# Patient Record
Sex: Female | Born: 1990
Health system: Southern US, Community
[De-identification: ages and names within clinical notes are randomized; demographics above are authoritative.]

## PROBLEM LIST (undated history)

## (undated) ENCOUNTER — Emergency Department (HOSPITAL_COMMUNITY): Discharge status: Absent without leave | Payer: No Typology Code available for payment source

## (undated) DIAGNOSIS — A599 Trichomoniasis, unspecified: Secondary | ICD-10-CM

## (undated) HISTORY — PX: HAND SURGERY: SHX662

## (undated) HISTORY — DX: Trichomoniasis, unspecified: A59.9

---
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
## 2014-02-07 ENCOUNTER — Encounter (HOSPITAL_COMMUNITY): Payer: Self-pay | Admitting: Emergency Medicine

## 2014-02-07 ENCOUNTER — Emergency Department (HOSPITAL_COMMUNITY)
Admission: EM | Admit: 2014-02-07 | Discharge: 2014-02-07 | Disposition: A | Discharge status: Home / Self Care | Payer: Medicaid Other | Attending: Emergency Medicine | Admitting: Emergency Medicine

## 2014-02-07 ENCOUNTER — Emergency Department (HOSPITAL_COMMUNITY): Payer: Medicaid Other

## 2014-02-07 DIAGNOSIS — Y92410 Unspecified street and highway as the place of occurrence of the external cause: Secondary | ICD-10-CM | POA: Diagnosis not present

## 2014-02-07 DIAGNOSIS — S8990XA Unspecified injury of unspecified lower leg, initial encounter: Secondary | ICD-10-CM | POA: Diagnosis present

## 2014-02-07 DIAGNOSIS — S99929A Unspecified injury of unspecified foot, initial encounter: Secondary | ICD-10-CM | POA: Diagnosis present

## 2014-02-07 DIAGNOSIS — S8991XA Unspecified injury of right lower leg, initial encounter: Secondary | ICD-10-CM

## 2014-02-07 DIAGNOSIS — S0990XA Unspecified injury of head, initial encounter: Secondary | ICD-10-CM | POA: Diagnosis not present

## 2014-02-07 DIAGNOSIS — Y9389 Activity, other specified: Secondary | ICD-10-CM | POA: Diagnosis not present

## 2014-02-07 DIAGNOSIS — F17200 Nicotine dependence, unspecified, uncomplicated: Secondary | ICD-10-CM | POA: Insufficient documentation

## 2014-02-07 DIAGNOSIS — S99919A Unspecified injury of unspecified ankle, initial encounter: Principal | ICD-10-CM

## 2014-02-07 MED ORDER — METHOCARBAMOL 500 MG PO TABS: 500.0000 mg | ORAL_TABLET | Freq: Two times a day (BID) | ORAL | Status: DC

## 2014-02-07 MED ORDER — IBUPROFEN 800 MG PO TABS
800.0000 mg | ORAL_TABLET | Freq: Once | ORAL | Status: AC
  Administered 2014-02-07: 800 mg via ORAL
  Filled 2014-02-07: qty 1

## 2014-02-07 MED ORDER — IBUPROFEN 800 MG PO TABS: 800.0000 mg | ORAL_TABLET | Freq: Three times a day (TID) | ORAL | Status: DC
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
## 2014-02-07 NOTE — ED Provider Notes (Signed)
Medical screening examination/treatment/procedure(s) were performed by non-physician practitioner and as supervising physician I was immediately available for consultation/collaboration.   EKG Interpretation None        Audree CamelScott T Janelys Glassner, MD 02/07/14 2221

## 2014-02-07 NOTE — Discharge Instructions (Signed)

## 2014-02-07 NOTE — Progress Notes (Signed)
Orthopedic Tech Progress Note Patient Details:  Susie CassetteMariah J Mccuistion 06/03/1991 [id number] Applied knee sleeve to RLE.  Pulses, sensation, motion intact before and after application.  Capillary refill less than 2 seconds before and after application. Ortho Devices Type of Ortho Device: Knee Sleeve Ortho Device/Splint Location: RLE Ortho Device/Splint Interventions: Application   Lesle ChrisGilliland, Ronniesha Seibold L 02/07/2014, 5:49 PM
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
## 2015-01-20 ENCOUNTER — Encounter (HOSPITAL_COMMUNITY): Payer: Self-pay | Admitting: *Deleted

## 2015-01-20 ENCOUNTER — Emergency Department (HOSPITAL_COMMUNITY): Payer: Medicaid Other

## 2015-01-20 ENCOUNTER — Emergency Department (HOSPITAL_COMMUNITY)
Admission: EM | Admit: 2015-01-20 | Discharge: 2015-01-20 | Disposition: A | Discharge status: Home / Self Care | Payer: Medicaid Other | Attending: Emergency Medicine | Admitting: Emergency Medicine

## 2015-01-20 DIAGNOSIS — Z720 Tobacco use: Secondary | ICD-10-CM | POA: Insufficient documentation

## 2015-01-20 DIAGNOSIS — Y998 Other external cause status: Secondary | ICD-10-CM | POA: Diagnosis not present

## 2015-01-20 DIAGNOSIS — S299XXA Unspecified injury of thorax, initial encounter: Secondary | ICD-10-CM | POA: Insufficient documentation

## 2015-01-20 DIAGNOSIS — Z79899 Other long term (current) drug therapy: Secondary | ICD-10-CM | POA: Diagnosis not present

## 2015-01-20 DIAGNOSIS — S0083XA Contusion of other part of head, initial encounter: Secondary | ICD-10-CM

## 2015-01-20 DIAGNOSIS — Z3202 Encounter for pregnancy test, result negative: Secondary | ICD-10-CM | POA: Diagnosis not present

## 2015-01-20 DIAGNOSIS — S0592XA Unspecified injury of left eye and orbit, initial encounter: Secondary | ICD-10-CM | POA: Diagnosis present

## 2015-01-20 DIAGNOSIS — Z791 Long term (current) use of non-steroidal anti-inflammatories (NSAID): Secondary | ICD-10-CM | POA: Diagnosis not present

## 2015-01-20 DIAGNOSIS — T1490XA Injury, unspecified, initial encounter: Secondary | ICD-10-CM

## 2015-01-20 DIAGNOSIS — Y9389 Activity, other specified: Secondary | ICD-10-CM | POA: Diagnosis not present

## 2015-01-20 DIAGNOSIS — S022XXA Fracture of nasal bones, initial encounter for closed fracture: Secondary | ICD-10-CM | POA: Insufficient documentation

## 2015-01-20 DIAGNOSIS — Y92009 Unspecified place in unspecified non-institutional (private) residence as the place of occurrence of the external cause: Secondary | ICD-10-CM | POA: Diagnosis not present

## 2015-01-20 LAB — POC URINE PREG, ED: Preg Test, Ur: NEGATIVE

## 2015-01-20 MED ORDER — OXYCODONE-ACETAMINOPHEN 5-325 MG PO TABS: 1.0000 | ORAL_TABLET | Freq: Once | ORAL | Status: DC

## 2015-01-20 MED ORDER — OXYCODONE-ACETAMINOPHEN 5-325 MG PO TABS
1.0000 | ORAL_TABLET | Freq: Once | ORAL | Status: AC
  Administered 2015-01-20: 1 via ORAL
  Filled 2015-01-20: qty 1

## 2015-01-20 NOTE — Discharge Instructions (Signed)
Contusion A contusion is a deep bruise. Contusions are the result of an injury that caused bleeding under the skin. The contusion may turn blue, purple, or yellow. Minor injuries will give you a painless contusion, but more severe contusions may stay painful and swollen for a few weeks.  CAUSES  A contusion is usually caused by a blow, trauma, or direct force to an area of the body. SYMPTOMS   Swelling and redness of the injured area.  Bruising of the injured area.  Tenderness and soreness of the injured area.  Pain. DIAGNOSIS  The diagnosis can be made by taking a history and physical exam. An X-ray, CT scan, or MRI may be needed to determine if there were any associated injuries, such as fractures. TREATMENT  Specific treatment will depend on what area of the body was injured. In general, the best treatment for a contusion is resting, icing, elevating, and applying cold compresses to the injured area. Over-the-counter medicines may also be recommended for pain control. Ask your caregiver what the best treatment is for your contusion. HOME CARE INSTRUCTIONS   Put ice on the injured area.  Put ice in a plastic bag.  Place a towel between your skin and the bag.  Leave the ice on for 15-20 minutes, 3-4 times a day, or as directed by your health care provider.  Only take over-the-counter or prescription medicines for pain, discomfort, or fever as directed by your caregiver. Your caregiver may recommend avoiding anti-inflammatory medicines (aspirin, ibuprofen, and naproxen) for 48 hours because these medicines may increase bruising.  Rest the injured area.  If possible, elevate the injured area to reduce swelling. SEEK IMMEDIATE MEDICAL CARE IF:   You have increased bruising or swelling.  You have pain that is getting worse.  Your swelling or pain is not relieved with medicines. MAKE SURE YOU:   Understand these instructions.  Will watch your condition.  Will get help right  away if you are not doing well or get worse. Document Released: 04/01/2005 Document Revised: 06/27/2013 Document Reviewed: 04/27/2011 [facility] Patient Information 2015 [place], [place]. This information is not intended to replace advice given to you by your health care provider. Make sure you discuss any questions you have with your health care provider. Nasal Fracture A nasal fracture is a break or crack in the bones of the nose. A minor break usually heals in a month. You often will receive black eyes from a nasal fracture. This is not a cause for concern. The black eyes will go away over 1 to 2 weeks.  DIAGNOSIS  Your caregiver may want to examine you if you are concerned about a fracture of the nose. X-rays of the nose may not show a nasal fracture even when one is present. Sometimes your caregiver must wait 1 to 5 days after the injury to re-check the nose for alignment and to take additional X-rays. Sometimes the caregiver must wait until the swelling has gone down. TREATMENT Minor fractures that have caused no deformity often do not require treatment. More serious fractures where bones are displaced may require surgery. This will take place after the swelling is gone. Surgery will stabilize and align the fracture. HOME CARE INSTRUCTIONS   Put ice on the injured area.  Put ice in a plastic bag.  Place a towel between your skin and the bag.  Leave the ice on for 15-20 minutes, 03-04 times a day.  Take medications as directed by your caregiver.  Only take over-the-counter  or prescription medicines for pain, discomfort, or fever as directed by your caregiver.  If your nose starts bleeding, squeeze the soft parts of the nose against the center wall while you are sitting in an upright position for 10 minutes.  Contact sports should be avoided for at least 3 to 4 weeks or as directed by your caregiver. SEEK MEDICAL CARE IF:  Your pain increases or becomes severe.  You continue to have  nosebleeds.  The shape of your nose does not return to normal within 5 days.  You have pus draining from the nose. SEEK IMMEDIATE MEDICAL CARE IF:   You have bleeding from your nose that does not stop after 20 minutes of pinching the nostrils closed and keeping ice on the nose.  You have clear fluid draining from your nose.  You notice a grape-like swelling on the dividing wall between the nostrils (septum). This is a collection of blood (hematoma) that must be drained to help prevent infection.  You have difficulty moving your eyes.  You have recurrent vomiting. Document Released: 06/19/2000 Document Revised: 09/14/2011 Document Reviewed: 10/06/2010 [facility] Patient Information 2015 [place]. This information is not intended to replace advice given to you by your health care provider. Make sure you discuss any questions you have with your health care provider.
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
## 2015-01-20 NOTE — ED Notes (Signed)
Pt d/c, verbalized understanding d/c instructions.

## 2015-01-20 NOTE — ED Notes (Signed)
Pt given po fluids, aware that a urine sample is needed.
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
## 2015-01-20 NOTE — ED Notes (Signed)
NT Gerilyn PilgrimJacob asked for urine sample and is unable to do give at the moment. Juice has been given.

## 2015-08-07 ENCOUNTER — Emergency Department (HOSPITAL_COMMUNITY): Payer: Medicaid Other

## 2015-08-07 ENCOUNTER — Encounter (HOSPITAL_COMMUNITY): Payer: Self-pay | Admitting: Emergency Medicine

## 2015-08-07 ENCOUNTER — Emergency Department (HOSPITAL_COMMUNITY)
Admission: EM | Admit: 2015-08-07 | Discharge: 2015-08-07 | Disposition: A | Discharge status: Home / Self Care | Payer: Medicaid Other | Attending: Emergency Medicine | Admitting: Emergency Medicine

## 2015-08-07 DIAGNOSIS — R05 Cough: Secondary | ICD-10-CM | POA: Diagnosis present

## 2015-08-07 DIAGNOSIS — R51 Headache: Secondary | ICD-10-CM | POA: Diagnosis not present

## 2015-08-07 DIAGNOSIS — R197 Diarrhea, unspecified: Secondary | ICD-10-CM | POA: Diagnosis not present

## 2015-08-07 DIAGNOSIS — R1084 Generalized abdominal pain: Secondary | ICD-10-CM | POA: Diagnosis not present

## 2015-08-07 DIAGNOSIS — R1915 Other abnormal bowel sounds: Secondary | ICD-10-CM | POA: Diagnosis not present

## 2015-08-07 DIAGNOSIS — R110 Nausea: Secondary | ICD-10-CM | POA: Insufficient documentation

## 2015-08-07 DIAGNOSIS — F17200 Nicotine dependence, unspecified, uncomplicated: Secondary | ICD-10-CM | POA: Insufficient documentation

## 2015-08-07 DIAGNOSIS — R5383 Other fatigue: Secondary | ICD-10-CM | POA: Diagnosis not present

## 2015-08-07 DIAGNOSIS — Z3202 Encounter for pregnancy test, result negative: Secondary | ICD-10-CM | POA: Diagnosis not present

## 2015-08-07 DIAGNOSIS — M791 Myalgia: Secondary | ICD-10-CM | POA: Insufficient documentation

## 2015-08-07 DIAGNOSIS — R0981 Nasal congestion: Secondary | ICD-10-CM | POA: Diagnosis not present

## 2015-08-07 DIAGNOSIS — R6889 Other general symptoms and signs: Secondary | ICD-10-CM

## 2015-08-07 LAB — CBC
HEMATOCRIT: 39.5 % (ref 36.0–46.0)
Hemoglobin: 13.6 g/dL (ref 12.0–15.0)
MCH: 32.9 pg (ref 26.0–34.0)
MCHC: 34.4 g/dL (ref 30.0–36.0)
MCV: 95.6 fL (ref 78.0–100.0)
Platelets: 223 10*3/uL (ref 150–400)
RBC: 4.13 MIL/uL (ref 3.87–5.11)
RDW: 12.7 % (ref 11.5–15.5)
WBC: 6.6 10*3/uL (ref 4.0–10.5)

## 2015-08-07 LAB — URINE MICROSCOPIC-ADD ON

## 2015-08-07 LAB — COMPREHENSIVE METABOLIC PANEL
ALBUMIN: 4 g/dL (ref 3.5–5.0)
ALT: 14 U/L (ref 14–54)
AST: 18 U/L (ref 15–41)
Alkaline Phosphatase: 47 U/L (ref 38–126)
Anion gap: 10 (ref 5–15)
CHLORIDE: 103 mmol/L (ref 101–111)
CO2: 25 mmol/L (ref 22–32)
Calcium: 9 mg/dL (ref 8.9–10.3)
Creatinine, Ser: 0.78 mg/dL (ref 0.44–1.00)
GFR calc Af Amer: >60 mL/min (ref >60)
Glucose, Bld: 88 mg/dL (ref 65–99)
POTASSIUM: 3.6 mmol/L (ref 3.5–5.1)
SODIUM: 138 mmol/L (ref 135–145)
Total Bilirubin: 1.8 mg/dL — ABNORMAL HIGH (ref 0.3–1.2)
Total Protein: 7 g/dL (ref 6.5–8.1)

## 2015-08-07 LAB — POC URINE PREG, ED: Preg Test, Ur: NEGATIVE

## 2015-08-07 LAB — URINALYSIS, ROUTINE W REFLEX MICROSCOPIC
Bilirubin Urine: NEGATIVE
GLUCOSE, UA: NEGATIVE mg/dL
KETONES UR: 15 mg/dL — AB
LEUKOCYTES UA: NEGATIVE
Nitrite: NEGATIVE
PH: 7.5 (ref 5.0–8.0)
Protein, ur: NEGATIVE mg/dL
Specific Gravity, Urine: 1.014 (ref 1.005–1.030)

## 2015-08-07 LAB — LIPASE, BLOOD: LIPASE: 24 U/L (ref 11–51)

## 2015-08-07 MED ORDER — SODIUM CHLORIDE 0.9 % IV BOLUS (SEPSIS)
1000.0000 mL | Freq: Once | INTRAVENOUS | Status: AC
  Administered 2015-08-07: 1000 mL via INTRAVENOUS

## 2015-08-07 MED ORDER — KETOROLAC TROMETHAMINE 30 MG/ML IJ SOLN
30.0000 mg | Freq: Once | INTRAMUSCULAR | Status: AC
  Administered 2015-08-07: 30 mg via INTRAVENOUS
  Filled 2015-08-07: qty 1

## 2015-08-07 MED ORDER — ONDANSETRON HCL 4 MG PO TABS: 4.0000 mg | ORAL_TABLET | Freq: Four times a day (QID) | ORAL | Status: DC

## 2015-08-07 NOTE — ED Provider Notes (Signed)
CSN: [id number]     Arrival date & time 08/07/15  1350 History   First MD Initiated Contact with Patient 08/07/15 1623     Chief Complaint  Patient presents with  . URI  . Diarrhea   HPI  Brooke Mejia is a 25 year old female presenting with multiple complaints. She reports onset of URI symptoms and diarrhea 3 days ago. She endorses nasal congestion, throbbing headache and productive cough. She states she is coughing up green-yellow sputum. She denies purulent nasal discharge. Her headache is generalized and mild. She endorses generalized myalgias. She states that her whole body feels like it is throbbing with her head. She endorses daily episodes of diarrhea. She states that she has approximately 4 episodes of diarrhea per day. She states that it is watery stool without blood. She reports generalized cramping abdominal pain precedes the diarrhea. After having a bowel movement, the abdominal pain resolves until her next bowel movement. She is also complaining of intermittent nausea without vomiting. Her son was evaluated in the emergency department earlier today for similar symptoms. He is diagnosed with a viral illness and discharged. He did not get her flu shot this year. She is a Archivist. Denies fevers, chills, dizziness, syncope, vision changes, neck pain, neck stiffness, ear pain, eye discharge, sore throat, chest pain, SOB, dysuria, or rashes. She has taken dayquil, nyquil and tamiflu without relief.   History reviewed. No pertinent past medical history. Past Surgical History  Procedure Laterality Date  . Hand surgery     No family history on file. Social History  Substance Use Topics  . Smoking status: Current Every Day Smoker  . Smokeless tobacco: None  . Alcohol Use: No   OB History    No data available     Review of Systems  Constitutional: Positive for fatigue. Negative for fever and chills.  HENT: Positive for congestion. Negative for ear pain, rhinorrhea and sore throat.    Eyes: Negative for discharge, redness and visual disturbance.  Respiratory: Positive for cough. Negative for shortness of breath and wheezing.   Cardiovascular: Negative for chest pain.  Gastrointestinal: Positive for nausea, abdominal pain and diarrhea. Negative for vomiting.  Genitourinary: Negative for dysuria and flank pain.  Musculoskeletal: Positive for myalgias. Negative for neck pain and neck stiffness.  Skin: Negative for rash.  Neurological: Positive for headaches. Negative for dizziness, syncope and weakness.  All other systems reviewed and are negative.     Allergies  Review of patient's allergies indicates no known allergies.  Home Medications   Prior to Admission medications   Medication Sig Start Date End Date Taking? Authorizing Provider  ibuprofen (ADVIL,MOTRIN) 200 MG tablet Take 400 mg by mouth every 6 (six) hours as needed for headache.    Yes Historical Provider, MD  levonorgestrel (MIRENA) 20 MCG/24HR IUD 1 each by Intrauterine route once. 08/2010   Yes Historical Provider, MD  Pseudoeph-Doxylamine-DM-APAP (NYQUIL PO) Take 15 mLs by mouth at bedtime as needed. For cold and flu symptoms   Yes Historical Provider, MD  Pseudoephedrine-APAP-DM (DAYQUIL PO) Take 15 mLs by mouth daily as needed. For cold and flu symptoms   Yes Historical Provider, MD  ondansetron (ZOFRAN) 4 MG tablet Take 1 tablet (4 mg total) by mouth every 6 (six) hours. 08/07/15   Juancarlos Crescenzo, PA-C  oxyCODONE-acetaminophen (PERCOCET/ROXICET) 5-325 MG per tablet Take 1-2 tablets by mouth once. Patient not taking: Reported on 08/07/2015 01/20/15   Lorre Nick, MD   BP 103/65 mmHg  Pulse 61  Temp(Src) 98.8 F (37.1 C) (Oral)  Resp 16  Ht  (1.702 m)  Wt 56.7 kg  BMI 19.57 kg/m2  SpO2 100%  LMP 07/21/2015 Physical Exam  Constitutional: She appears well-developed and well-nourished. No distress.  HENT:  Head: Normocephalic and atraumatic.  Mouth/Throat: Oropharynx is clear and moist. No  oropharyngeal exudate.  Eyes: Conjunctivae are normal. Right eye exhibits no discharge. Left eye exhibits no discharge. No scleral icterus.  Neck: Normal range of motion. Neck supple.  Cardiovascular: Normal rate, regular rhythm and normal heart sounds.   Pulmonary/Chest: Effort normal and breath sounds normal. No respiratory distress. She has no wheezes. She has no rales.  Abdominal: Soft. She exhibits no distension. Bowel sounds are increased. There is no tenderness. There is no rebound and no guarding.  Musculoskeletal: Normal range of motion.  Lymphadenopathy:    She has no cervical adenopathy.  Neurological: She is alert. Coordination normal.  Skin: Skin is warm and dry.  Psychiatric: She has a normal mood and affect. Her behavior is normal.  Nursing note and vitals reviewed.   ED Course  Procedures (including critical care time) Labs Review Labs Reviewed  COMPREHENSIVE METABOLIC PANEL - Abnormal; Notable for the following:    BUN <5 (*)    Total Bilirubin 1.8 (*)    All other components within normal limits  URINALYSIS, ROUTINE W REFLEX MICROSCOPIC (NOT AT [facility]) - Abnormal; Notable for the following:    Hgb urine dipstick TRACE (*)    Ketones, ur 15 (*)    All other components within normal limits  URINE MICROSCOPIC-ADD ON - Abnormal; Notable for the following:    Squamous Epithelial / LPF 6-30 (*)    Bacteria, UA FEW (*)    All other components within normal limits  LIPASE, BLOOD  CBC  POC URINE PREG, ED    Imaging Review Dg Chest 2 View  08/07/2015  CLINICAL DATA:  25 year old female with lightheadedness for the past 2 days and productive cough (yellowish green phlegm) for the past 3 days. EXAM: CHEST  2 VIEW COMPARISON:  Chest x-ray a 01/20/2015. FINDINGS: Lung volumes are normal. No consolidative airspace disease. No pleural effusions. No pneumothorax. No pulmonary nodule or mass noted. Pulmonary vasculature and the cardiomediastinal silhouette are within normal limits.  IMPRESSION: No radiographic evidence of acute cardiopulmonary disease. Electronically Signed   By: Trudie Reed M.D.   On: 08/07/2015 15:58   I have personally reviewed and evaluated these images and lab results as part of my medical decision-making.   EKG Interpretation None      MDM   Final diagnoses:  Flu-like symptoms   25 year old female presenting with flulike illness with symptoms including generalized myalgias, fatigue, productive cough, congestion, abdominal cramping and diarrhea x 3 days. Vital signs stable. Patient is afebrile and nontoxic appearing. No nasal mucosa edema noted. TMs gray with good visualization anatomy bilaterally. No oropharyngeal erythema or exudate. No cervical adenopathy. Lungs are clear to auscultation bilaterally. Heart regular rate and rhythm. Abdomen is soft without tenderness or peritoneal signs. Patient has not had an episode of diarrhea while in the emergency department. Lab work is unremarkable. Chest x-ray negative for acute findings. She did with Toradol and 1 L IV fluid bolus. Patient reports improvement in her symptoms and is requesting to go home. Patient's presentation consistent with a viral illness. We'll discharge with Zofran. Return precautions given in discharge paperwork and discussed with pt at bedside. Pt stable for discharge  Alveta Heimlich, PA-C 08/07/15 1936  Nelva Nay, MD 08/11/15 [phone]

## 2015-08-07 NOTE — ED Notes (Signed)
Onset 3 days ago developed productive cough yellow sputum and diarrhea 2 days ago watery stool. General abdominal pain achy cramping 6/10.

## 2015-08-07 NOTE — Discharge Instructions (Signed)
Upper Respiratory Infection, Adult °Most upper respiratory infections (URIs) are a viral infection of the air passages leading to the lungs. A URI affects the nose, throat, and upper air passages. The most common type of URI is nasopharyngitis and is typically referred to as "the common cold." °URIs run their course and usually go away on their own. Most of the time, a URI does not require medical attention, but sometimes a bacterial infection in the upper airways can follow a viral infection. This is called a secondary infection. Sinus and middle ear infections are common types of secondary upper respiratory infections. °Bacterial pneumonia can also complicate a URI. A URI can worsen asthma and chronic obstructive pulmonary disease (COPD). Sometimes, these complications can require emergency medical care and may be life threatening.  °CAUSES °Almost all URIs are caused by viruses. A virus is a type of germ and can spread from one person to another.  °RISKS FACTORS °You may be at risk for a URI if:  °· You smoke.   °· You have chronic heart or lung disease. °· You have a weakened defense (immune) system.   °· You are very young or very old.   °· You have nasal allergies or asthma. °· You work in crowded or poorly ventilated areas. °· You work in health care facilities or schools. °SIGNS AND SYMPTOMS  °Symptoms typically develop 2-3 days after you come in contact with a cold virus. Most viral URIs last 7-10 days. However, viral URIs from the influenza virus (flu virus) can last 14-18 days and are typically more severe. Symptoms may include:  °· Runny or stuffy (congested) nose.   °· Sneezing.   °· Cough.   °· Sore throat.   °· Headache.   °· Fatigue.   °· Fever.   °· Loss of appetite.   °· Pain in your forehead, behind your eyes, and over your cheekbones (sinus pain). °· Muscle aches.   °DIAGNOSIS  °Your health care provider may diagnose a URI by: °· Physical exam. °· Tests to check that your symptoms are not due to  another condition such as: °· Strep throat. °· Sinusitis. °· Pneumonia. °· Asthma. °TREATMENT  °A URI goes away on its own with time. It cannot be cured with medicines, but medicines may be prescribed or recommended to relieve symptoms. Medicines may help: °· Reduce your fever. °· Reduce your cough. °· Relieve nasal congestion. °HOME CARE INSTRUCTIONS  °· Take medicines only as directed by your health care provider.   °· Gargle warm saltwater or take cough drops to comfort your throat as directed by your health care provider. °· Use a warm mist humidifier or inhale steam from a shower to increase air moisture. This may make it easier to breathe. °· Drink enough fluid to keep your urine clear or pale yellow.   °· Eat soups and other clear broths and maintain good nutrition.   °· Rest as needed.   °· Return to work when your temperature has returned to normal or as your health care provider advises. You may need to stay home longer to avoid infecting others. You can also use a face mask and careful hand washing to prevent spread of the virus. °· Increase the usage of your inhaler if you have asthma.   °· Do not use any tobacco products, including cigarettes, chewing tobacco, or electronic cigarettes. If you need help quitting, ask your health care provider. °PREVENTION  °The best way to protect yourself from getting a cold is to practice good hygiene.  °· Avoid oral or hand contact with people with cold   symptoms.   °· Wash your hands often if contact occurs.   °There is no clear evidence that vitamin C, vitamin E, echinacea, or exercise reduces the chance of developing a cold. However, it is always recommended to get plenty of rest, exercise, and practice good nutrition.  °SEEK MEDICAL CARE IF:  °· You are getting worse rather than better.   °· Your symptoms are not controlled by medicine.   °· You have chills. °· You have worsening shortness of breath. °· You have brown or red mucus. °· You have yellow or brown nasal  discharge. °· You have pain in your face, especially when you bend forward. °· You have a fever. °· You have swollen neck glands. °· You have pain while swallowing. °· You have white areas in the back of your throat. °SEEK IMMEDIATE MEDICAL CARE IF:  °· You have severe or persistent: °¨ Headache. °¨ Ear pain. °¨ Sinus pain. °¨ Chest pain. °· You have chronic lung disease and any of the following: °¨ Wheezing. °¨ Prolonged cough. °¨ Coughing up blood. °¨ A change in your usual mucus. °· You have a stiff neck. °· You have changes in your: °¨ Vision. °¨ Hearing. °¨ Thinking. °¨ Mood. °MAKE SURE YOU:  °· Understand these instructions. °· Will watch your condition. °· Will get help right away if you are not doing well or get worse. °  °This information is not intended to replace advice given to you by your health care provider. Make sure you discuss any questions you have with your health care provider. °  °Document Released: 12/16/2000 Document Revised: 11/06/2014 Document Reviewed: 09/27/2013 °Elsevier Interactive Patient Education ©2016 [employer] °Diarrhea °Diarrhea is frequent loose and watery bowel movements. It can cause you to feel weak and dehydrated. Dehydration can cause you to become tired and thirsty, have a dry mouth, and have decreased urination that often is dark yellow. Diarrhea is a sign of another problem, most often an infection that will not last long. In most cases, diarrhea typically lasts 2-3 days. However, it can last longer if it is a sign of something more serious. It is important to treat your diarrhea as directed by your caregiver to lessen or prevent future episodes of diarrhea. °CAUSES  °Some common causes include: °· Gastrointestinal infections caused by viruses, bacteria, or parasites. °· Food poisoning or food allergies. °· Certain medicines, such as antibiotics, chemotherapy, and laxatives. °· Artificial sweeteners and fructose. °· Digestive disorders. °HOME CARE  INSTRUCTIONS °· Ensure adequate fluid intake (hydration): Have 1 cup (8 oz) of fluid for each diarrhea episode. Avoid fluids that contain simple sugars or sports drinks, fruit juices, whole milk products, and sodas. Your urine should be clear or pale yellow if you are drinking enough fluids. Hydrate with an oral rehydration solution that you can purchase at pharmacies, retail stores, and online. You can prepare an oral rehydration solution at home by mixing the following ingredients together: °¨  - tsp table salt. °¨ ¾ tsp baking soda. °¨  tsp salt substitute containing potassium chloride. °¨ 1  tablespoons sugar. °¨ 1 L (34 oz) of water. °· Certain foods and beverages may increase the speed at which food moves through the gastrointestinal (GI) tract. These foods and beverages should be avoided and include: °¨ Caffeinated and alcoholic beverages. °¨ High-fiber foods, such as raw fruits and vegetables, nuts, seeds, and whole grain breads and cereals. °¨ Foods and beverages sweetened with sugar alcohols, such as xylitol, sorbitol, and mannitol. °· Some foods may   be well tolerated and may help thicken stool including: °¨ Starchy foods, such as rice, toast, pasta, low-sugar cereal, oatmeal, grits, baked potatoes, crackers, and bagels. °¨ Bananas. °¨ Applesauce. °· Add probiotic-rich foods to help increase healthy bacteria in the GI tract, such as yogurt and fermented milk products. °· Wash your hands well after each diarrhea episode. °· Only take over-the-counter or prescription medicines as directed by your caregiver. °· Take a warm bath to relieve any burning or pain from frequent diarrhea episodes. °SEEK IMMEDIATE MEDICAL CARE IF:  °· You are unable to keep fluids down. °· You have persistent vomiting. °· You have blood in your stool, or your stools are black and tarry. °· You do not urinate in 6-8 hours, or there is only a small amount of very dark urine. °· You have abdominal pain that increases or  localizes. °· You have weakness, dizziness, confusion, or light-headedness. °· You have a severe headache. °· Your diarrhea gets worse or does not get better. °· You have a fever or persistent symptoms for more than 2-3 days. °· You have a fever and your symptoms suddenly get worse. °MAKE SURE YOU:  °· Understand these instructions. °· Will watch your condition. °· Will get help right away if you are not doing well or get worse. °  °This information is not intended to replace advice given to you by your health care provider. Make sure you discuss any questions you have with your health care provider. °  °Document Released: 06/12/2002 Document Revised: 07/13/2014 Document Reviewed: 02/28/2012 °Elsevier Interactive Patient Education ©2016 [employer] ° °

## 2015-08-08 ENCOUNTER — Encounter (HOSPITAL_COMMUNITY): Payer: Self-pay | Admitting: *Deleted

## 2015-08-08 ENCOUNTER — Emergency Department (HOSPITAL_COMMUNITY)
Admission: EM | Admit: 2015-08-08 | Discharge: 2015-08-08 | Disposition: A | Discharge status: Home / Self Care | Payer: Medicaid Other | Attending: Emergency Medicine | Admitting: Emergency Medicine

## 2015-08-08 DIAGNOSIS — Z793 Long term (current) use of hormonal contraceptives: Secondary | ICD-10-CM | POA: Insufficient documentation

## 2015-08-08 DIAGNOSIS — Z79899 Other long term (current) drug therapy: Secondary | ICD-10-CM | POA: Insufficient documentation

## 2015-08-08 DIAGNOSIS — J111 Influenza due to unidentified influenza virus with other respiratory manifestations: Secondary | ICD-10-CM

## 2015-08-08 DIAGNOSIS — R05 Cough: Secondary | ICD-10-CM | POA: Diagnosis present

## 2015-08-08 DIAGNOSIS — R197 Diarrhea, unspecified: Secondary | ICD-10-CM | POA: Diagnosis not present

## 2015-08-08 MED ORDER — NAPROXEN 250 MG PO TABS: 250.0000 mg | ORAL_TABLET | Freq: Two times a day (BID) | ORAL | Status: DC

## 2015-08-08 MED ORDER — CETIRIZINE HCL 10 MG PO TABS: 10.0000 mg | ORAL_TABLET | Freq: Every day | ORAL | Status: DC

## 2015-08-08 MED ORDER — IBUPROFEN 800 MG PO TABS
800.0000 mg | ORAL_TABLET | Freq: Once | ORAL | Status: AC
  Administered 2015-08-08: 800 mg via ORAL
  Filled 2015-08-08: qty 1

## 2015-08-08 MED ORDER — IBUPROFEN 400 MG PO TABS
400.0000 mg | ORAL_TABLET | Freq: Once | ORAL | Status: DC
  Filled 2015-08-08: qty 1

## 2015-08-08 MED ORDER — ONDANSETRON 4 MG PO TBDP: 4.0000 mg | ORAL_TABLET | Freq: Three times a day (TID) | ORAL | Status: DC | PRN

## 2015-08-08 MED ORDER — ONDANSETRON 4 MG PO TBDP
4.0000 mg | ORAL_TABLET | Freq: Once | ORAL | Status: AC
  Administered 2015-08-08: 4 mg via ORAL
  Filled 2015-08-08: qty 1

## 2015-08-08 MED ORDER — ACIDOPHILUS PROBIOTIC 10 MG PO TABS: 10.0000 mg | ORAL_TABLET | Freq: Three times a day (TID) | ORAL | Status: DC

## 2015-08-08 NOTE — ED Notes (Signed)
Patient with body aches and diarrhea.  She was seen here for same on yesterday.  She has not taken anything for pain today.   She is alert but tearful due to discomfort

## 2015-08-08 NOTE — ED Notes (Signed)
Patient reports she has not felt well since Sunday.  She has body aches, headache, cough, diarrhea.   Generalized weakness.  She was seen here for same on yesterday.  She has not taken anything except nyquil last night

## 2015-08-08 NOTE — ED Provider Notes (Signed)
CSN: [id number]     Arrival date & time 08/08/15  1748 History   First MD Initiated Contact with Patient 08/08/15 1749     Chief Complaint  Patient presents with  . Generalized Body Aches  . Diarrhea  . Cough  . Nausea   Brooke Mejia is a 25 y.o. female who presents the emergency department complaining of flulike symptoms for the past 5 days. Patient was seen in the emergency department for the same yesterday. She reports she is unable to get her prescriptions filled. She complains of generalized body aches, nausea, coughing, sneezing, runny nose and post nasal drip. She reports taking NyQuil last night with some relief. She has had nothing for treatment today. She reports subjective fever at home. She did not get her flu shot this year. She was seen in the emergency department for the same symptoms yesterday and had an unremarkable chest x-ray. She denies rashes, lightheadedness, dizziness, trouble breathing, shortness of breath, wheezing, neck pain, neck stiffness, ear pain, sore throat, trouble swallowing, urinary symptoms or rashes.  (Consider location/radiation/quality/duration/timing/severity/associated sxs/prior Treatment) HPI  History reviewed. No pertinent past medical history. Past Surgical History  Procedure Laterality Date  . Hand surgery     No family history on file. Social History  Substance Use Topics  . Smoking status: Current Every Day Smoker  . Smokeless tobacco: None  . Alcohol Use: No   OB History    No data available     Review of Systems  Constitutional: Positive for fever.  HENT: Positive for rhinorrhea and sneezing. Negative for congestion, ear pain, sore throat and trouble swallowing.   Eyes: Negative for visual disturbance.  Respiratory: Negative for cough, shortness of breath and wheezing.   Cardiovascular: Negative for chest pain and palpitations.  Gastrointestinal: Positive for nausea and diarrhea. Negative for vomiting and abdominal pain.   Genitourinary: Negative for dysuria, urgency, frequency, hematuria and difficulty urinating.  Musculoskeletal: Positive for myalgias. Negative for back pain and neck pain.  Skin: Negative for rash.  Neurological: Negative for headaches.      Allergies  Review of patient's allergies indicates no known allergies.  Home Medications   Prior to Admission medications   Medication Sig Start Date End Date Taking? Authorizing Provider  cetirizine (ZYRTEC ALLERGY) 10 MG tablet Take 1 tablet (10 mg total) by mouth daily. 08/08/15   Everlene Farrier, PA-C  ibuprofen (ADVIL,MOTRIN) 200 MG tablet Take 400 mg by mouth every 6 (six) hours as needed for headache.     Historical Provider, MD  Lactobacillus (ACIDOPHILUS PROBIOTIC) 10 MG TABS Take 10 mg by mouth 3 (three) times daily. 08/08/15   Everlene Farrier, PA-C  levonorgestrel (MIRENA) 20 MCG/24HR IUD 1 each by Intrauterine route once. 08/2010    Historical Provider, MD  naproxen (NAPROSYN) 250 MG tablet Take 1 tablet (250 mg total) by mouth 2 (two) times daily with a meal. 08/08/15   Everlene Farrier, PA-C  ondansetron (ZOFRAN ODT) 4 MG disintegrating tablet Take 1 tablet (4 mg total) by mouth every 8 (eight) hours as needed for nausea or vomiting. 08/08/15   Everlene Farrier, PA-C  ondansetron (ZOFRAN) 4 MG tablet Take 1 tablet (4 mg total) by mouth every 6 (six) hours. 08/07/15   Stevi Barrett, PA-C  oxyCODONE-acetaminophen (PERCOCET/ROXICET) 5-325 MG per tablet Take 1-2 tablets by mouth once. Patient not taking: Reported on 08/07/2015 01/20/15   Lorre Nick, MD  Pseudoeph-Doxylamine-DM-APAP (NYQUIL PO) Take 15 mLs by mouth at bedtime as needed. For cold and  flu symptoms    Historical Provider, MD  Pseudoephedrine-APAP-DM (DAYQUIL PO) Take 15 mLs by mouth daily as needed. For cold and flu symptoms    Historical Provider, MD   BP 103/74 mmHg  Pulse 84  Temp(Src) 98.1 F (36.7 C) (Oral)  Resp 20  Wt 54.114 kg  SpO2 100%  LMP 07/21/2015 Physical Exam   Constitutional: She appears well-developed and well-nourished. No distress.  Nontoxic appearing.  HENT:  Head: Normocephalic and atraumatic.  Right Ear: External ear normal.  Left Ear: External ear normal.  Mouth/Throat: Oropharynx is clear and moist.  Bilateral tympanic membranes are pearly-gray without erythema or loss of landmarks.  Boggy nasal turbinates bilaterally.  Eyes: Conjunctivae are normal. Pupils are equal, round, and reactive to light. Right eye exhibits no discharge. Left eye exhibits no discharge.  Neck: Normal range of motion. Neck supple. No JVD present. No tracheal deviation present.  Cardiovascular: Normal rate, regular rhythm, normal heart sounds and intact distal pulses.  Exam reveals no gallop and no friction rub.   No murmur heard. Pulmonary/Chest: Effort normal and breath sounds normal. No respiratory distress. She has no wheezes. She has no rales.  Lungs clear to auscultation bilaterally.  Abdominal: Soft. Bowel sounds are normal. She exhibits no distension. There is no tenderness. There is no guarding.  Musculoskeletal: She exhibits no edema.  Lymphadenopathy:    She has no cervical adenopathy.  Neurological: She is alert. Coordination normal.  Skin: Skin is warm and dry. No rash noted. She is not diaphoretic. No erythema. No pallor.  Psychiatric: She has a normal mood and affect. Her behavior is normal.  Nursing note and vitals reviewed.   ED Course  Procedures (including critical care time) Labs Review Labs Reviewed - No data to display  Imaging Review Dg Chest 2 View  08/07/2015  CLINICAL DATA:  25 year old female with lightheadedness for the past 2 days and productive cough (yellowish green phlegm) for the past 3 days. EXAM: CHEST  2 VIEW COMPARISON:  Chest x-ray a 01/20/2015. FINDINGS: Lung volumes are normal. No consolidative airspace disease. No pleural effusions. No pneumothorax. No pulmonary nodule or mass noted. Pulmonary vasculature and the  cardiomediastinal silhouette are within normal limits. IMPRESSION: No radiographic evidence of acute cardiopulmonary disease. Electronically Signed   By: Trudie Reed M.D.   On: 08/07/2015 15:58   I have personally reviewed and evaluated these images as part of my medical decision-making.   EKG Interpretation None      Filed Vitals:   08/08/15 1755 08/08/15 1800  BP:  103/74  Pulse:  84  Temp:  98.1 F (36.7 C)  TempSrc:  Oral  Resp:  20  Weight: 54.114 kg   SpO2:  100%     MDM   Meds given in ED:  Medications  ibuprofen (ADVIL,MOTRIN) tablet 800 mg (800 mg Oral Given 08/08/15 1829)  ondansetron (ZOFRAN-ODT) disintegrating tablet 4 mg (4 mg Oral Given 08/08/15 1827)    New Prescriptions   CETIRIZINE (ZYRTEC ALLERGY) 10 MG TABLET    Take 1 tablet (10 mg total) by mouth daily.   LACTOBACILLUS (ACIDOPHILUS PROBIOTIC) 10 MG TABS    Take 10 mg by mouth 3 (three) times daily.   NAPROXEN (NAPROSYN) 250 MG TABLET    Take 1 tablet (250 mg total) by mouth 2 (two) times daily with a meal.   ONDANSETRON (ZOFRAN ODT) 4 MG DISINTEGRATING TABLET    Take 1 tablet (4 mg total) by mouth every 8 (eight) hours as  needed for nausea or vomiting.    Final diagnoses:  Influenza   This is a 25 y.o. female who presents the emergency department complaining of flulike symptoms for the past 5 days. Patient was seen in the emergency department for the same yesterday. She reports she is unable to get her prescriptions filled. She complains of generalized body aches, nausea, coughing, sneezing, runny nose and post nasal drip. She reports taking NyQuil last night with some relief. She has had nothing for treatment today.  On exam the patient is afebrile nontoxic appearing. Her throat is clear. Lungs are clear to auscultation bilaterally. No wheezes or rhonchi. TMs are normal bilaterally. Abdomen is soft and nontender. Patient had a chest x-ray yesterday which was negative for pneumonia. Patient with continued  influenza-like illness. Patient was provided with Zofran and ibuprofen. Had reevaluation patient reports feeling much better and she is tolerating oral liquids well. I discussed the expected course and treatment of influenza. She is out of the range for Tamiflu. We'll discharge with prescriptions for Zyrtec, probiotic, Naprosyn and Zofran. I discussed strict and specific return precautions. I advised the patient to follow-up with their primary care provider this week. I advised the patient to return to the emergency department with new or worsening symptoms or new concerns. The patient verbalized understanding and agreement with plan.      Everlene Farrier, PA-C 08/08/15 1937  Margarita Grizzle, MD 08/09/15 [phone]

## 2015-08-08 NOTE — Discharge Instructions (Signed)

## 2015-08-09 MED FILL — ONDANSETRON ODT 4 MG TABLET: 4 | 3 days supply | Qty: 10 | Fill #0

## 2017-06-06 ENCOUNTER — Emergency Department (HOSPITAL_COMMUNITY): Payer: Self-pay

## 2017-06-06 ENCOUNTER — Other Ambulatory Visit: Payer: Self-pay

## 2017-06-06 ENCOUNTER — Emergency Department (HOSPITAL_COMMUNITY)
Admission: EM | Admit: 2017-06-06 | Discharge: 2017-06-06 | Disposition: A | Discharge status: Home / Self Care | Payer: Self-pay | Attending: Emergency Medicine | Admitting: Emergency Medicine

## 2017-06-06 ENCOUNTER — Encounter (HOSPITAL_COMMUNITY): Payer: Self-pay | Admitting: Emergency Medicine

## 2017-06-06 DIAGNOSIS — N83209 Unspecified ovarian cyst, unspecified side: Secondary | ICD-10-CM

## 2017-06-06 DIAGNOSIS — N83202 Unspecified ovarian cyst, left side: Secondary | ICD-10-CM | POA: Insufficient documentation

## 2017-06-06 DIAGNOSIS — Z79899 Other long term (current) drug therapy: Secondary | ICD-10-CM | POA: Insufficient documentation

## 2017-06-06 LAB — URINALYSIS, ROUTINE W REFLEX MICROSCOPIC
BACTERIA UA: NONE SEEN
Bilirubin Urine: NEGATIVE
Glucose, UA: NEGATIVE mg/dL
KETONES UR: NEGATIVE mg/dL
NITRITE: NEGATIVE
PROTEIN: NEGATIVE mg/dL
Specific Gravity, Urine: 1.019 (ref 1.005–1.030)
WBC UA: NONE SEEN WBC/hpf (ref 0–5)
pH: 7 (ref 5.0–8.0)

## 2017-06-06 LAB — POC URINE PREG, ED: Preg Test, Ur: NEGATIVE

## 2017-06-06 MED ORDER — FENTANYL CITRATE (PF) 100 MCG/2ML IJ SOLN
100.0000 ug | Freq: Once | INTRAMUSCULAR | Status: AC
  Administered 2017-06-06: 100 ug via INTRAVENOUS
  Filled 2017-06-06: qty 2

## 2017-06-06 MED ORDER — ONDANSETRON 8 MG PO TBDP: 8.0000 mg | ORAL_TABLET | Freq: Three times a day (TID) | ORAL | 0 refills | Status: DC | PRN

## 2017-06-06 MED ORDER — ONDANSETRON HCL 4 MG/2ML IJ SOLN
4.0000 mg | Freq: Once | INTRAMUSCULAR | Status: AC
  Administered 2017-06-06: 4 mg via INTRAVENOUS
  Filled 2017-06-06: qty 2

## 2017-06-06 MED ORDER — SODIUM CHLORIDE 0.9 % IV SOLN
Freq: Once | INTRAVENOUS | Status: AC
  Administered 2017-06-06: 03:00:00 via INTRAVENOUS

## 2017-06-06 MED ORDER — HYDROCODONE-ACETAMINOPHEN 5-325 MG PO TABS: 1.0000 | ORAL_TABLET | Freq: Four times a day (QID) | ORAL | 0 refills | Status: DC | PRN

## 2017-06-06 NOTE — ED Notes (Signed)
US at bedside

## 2017-06-06 NOTE — ED Provider Notes (Addendum)
[facility]-EMERGENCY DEPT Provider Note: Lowella DellJ. Lane Fernado Brigante, MD, FACEP  CSN: [id number] MRN: [id number] ARRIVAL: 06/06/17 at 0123 ROOM: WA16/WA16   CHIEF COMPLAINT  Flank Pain   HISTORY OF PRESENT ILLNESS  06/06/17 2:42 AM Brooke Mejia is a 26 y.o. female with no significant past medical history.  She states she was constipated a week ago but self medicated with over-the-counter magnesium citrate which relieved her constipation.  She is here now with pain in her left flank that began about 8 PM yesterday evening.  She rates the pain as a 10 out of 10 and constant.  It radiates to her left upper abdomen.  It does not change with movement or palpation.  She has never had this pain before.  There has been associated nausea but no vomiting.  She is not having any dysuria or hematuria.  She has not taken anything for the pain.  Consultation with the [place] controlled substances database reveals the patient has received no opioid prescriptions in the past 2 years.   History reviewed. No pertinent past medical history.  Past Surgical History:  Procedure Laterality Date  . HAND SURGERY      History reviewed. No pertinent family history.  Social History   Tobacco Use  . Smoking status: Never Smoker  . Smokeless tobacco: Never Used  Substance Use Topics  . Alcohol use: No  . Drug use: No    Prior to Admission medications   Medication Sig Start Date End Date Taking? Authorizing Provider  levonorgestrel (MIRENA) 20 MCG/24HR IUD 1 each by Intrauterine route once. 08/2010   Yes [provider]  magnesium citrate SOLN Take 1 Bottle by mouth once.   Yes [provider]  cetirizine (ZYRTEC ALLERGY) 10 MG tablet Take 1 tablet (10 mg total) by mouth daily. Patient not taking: Reported on 06/06/2017 08/08/15   Everlene Farrieransie, William, PA-C  Lactobacillus (ACIDOPHILUS PROBIOTIC) 10 MG TABS Take 10 mg by mouth 3 (three) times daily. Patient not taking: Reported on 06/06/2017  08/08/15   Everlene Farrieransie, William, PA-C  oxyCODONE-acetaminophen (PERCOCET/ROXICET) 5-325 MG per tablet Take 1-2 tablets by mouth once. Patient not taking: Reported on 08/07/2015 01/20/15   Lorre NickAllen, Anthony, MD    Allergies Patient has no known allergies.   REVIEW OF SYSTEMS  Negative except as noted here or in the History of Present Illness.   PHYSICAL EXAMINATION  Initial Vital Signs Blood pressure 125/85, pulse 82, temperature 98 F (36.7 C), temperature source Oral, resp. rate 20, height 5\' 7"  (1.702 m), weight 59 kg (130 lb), last menstrual period 05/20/2017, SpO2 98 %.  Examination General: Well-developed, well-nourished female in no acute distress; appearance consistent with age of record HENT: normocephalic; atraumatic Eyes: pupils equal, round and reactive to light; extraocular muscles intact Neck: supple Heart: regular rate and rhythm Lungs: clear to auscultation bilaterally Abdomen: soft; nondistended; left suprapubic tenderness; no masses or hepatosplenomegaly; bowel sounds present  GU: No CVA tenderness Extremities: No deformity; full range of motion; pulses normal Neurologic: Awake, alert and oriented; motor function intact in all extremities and symmetric; no facial droop Skin: Warm and dry Psychiatric: Flat affect; appears uncomfortable   RESULTS  Summary of this visit's results, reviewed by myself:   EKG Interpretation  Date/Time:    Ventricular Rate:    PR Interval:    QRS Duration:   QT Interval:    QTC Calculation:   R Axis:     Text Interpretation:        Laboratory  Studies: Results for orders placed or performed during the hospital encounter of 06/06/17 (from the past 24 hour(s))  Urinalysis, Routine w reflex microscopic- may I&O cath if menses     Status: Abnormal   Collection Time: 06/06/17  1:50 AM  Result Value Ref Range   Color, Urine YELLOW YELLOW   APPearance CLOUDY (A) CLEAR   Specific Gravity, Urine 1.019 1.005 - 1.030   pH 7.0 5.0 - 8.0    Glucose, UA NEGATIVE NEGATIVE mg/dL   Hgb urine dipstick MODERATE (A) NEGATIVE   Bilirubin Urine NEGATIVE NEGATIVE   Ketones, ur NEGATIVE NEGATIVE mg/dL   Protein, ur NEGATIVE NEGATIVE mg/dL   Nitrite NEGATIVE NEGATIVE   Leukocytes, UA TRACE (A) NEGATIVE   RBC / HPF 6-30 0 - 5 RBC/hpf   WBC, UA NONE SEEN 0 - 5 WBC/hpf   Bacteria, UA NONE SEEN NONE SEEN   Squamous Epithelial / LPF 0-5 (A) NONE SEEN   Mucus PRESENT    Amorphous Crystal PRESENT   POC urine preg, ED     Status: None   Collection Time: 06/06/17  1:58 AM  Result Value Ref Range   Preg Test, Ur NEGATIVE NEGATIVE   Imaging Studies: Ct Renal Stone Study  Result Date: 06/06/2017 CLINICAL DATA:  Left flank pain. EXAM: CT ABDOMEN AND PELVIS WITHOUT CONTRAST TECHNIQUE: Multidetector CT imaging of the abdomen and pelvis was performed following the standard protocol without IV contrast. COMPARISON:  None. FINDINGS: Lower chest: The lung bases are clear. Hepatobiliary: No focal hepatic lesion allowing for lack contrast. Gallbladder physiologically distended, no calcified stone. No biliary dilatation. Pancreas: No ductal dilatation or inflammation. More detailed evaluation limited by lack of contrast and paucity of intra-abdominal fat. Spleen: Normal in size without focal abnormality. Adrenals/Urinary Tract: Normal adrenal glands. Mild left caliectasis. No renal, ureter, or bladder stone. Ureters decompressed. Unenhanced right kidney is unremarkable. Urinary bladder is physiologically distended without wall thickening. Stomach/Bowel: Lack of IV and enteric contrast and paucity of intra-abdominal fat limits bowel assessment. Scattered fluid-filled nondilated small bowel loops and transverse colon. No gross bowel inflammation or wall thickening. The appendix is not confidently visualized. Vascular/Lymphatic: Normal caliber abdominal aorta. No bulky abdominal or pelvic adenopathy. Reproductive: Uterus is anteverted. 3.8 cm cyst in the left ovary.  Right ovary not well seen. Small amount of free fluid in the pelvis is likely physiologic. Other: No free air.  No upper abdominal ascites. Musculoskeletal: There are no acute or suspicious osseous abnormalities. 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 IMPRESSION: 1. Minimal left caliectasis. No urolithiasis. Findings may be related to recently passed stone or urinary tract infection. 2. Left ovarian cyst measures 3.8 cm. Recommend pelvic ultrasound if there are any symptoms referable to the pelvis. Electronically Signed   By: Rubye Oaks M.D.   On: 06/06/2017 03:48   US Pelvic Complete With Transvaginal  Result Date: 06/06/2017 CLINICAL DATA:  Initial evaluation for ovarian cyst seen on prior CT. EXAM: TRANSABDOMINAL AND TRANSVAGINAL ULTRASOUND OF PELVIS TECHNIQUE: Both transabdominal and transvaginal ultrasound examinations of the pelvis were performed. Transabdominal technique was performed for global imaging of the pelvis including uterus, ovaries, adnexal regions, and pelvic cul-de-sac. It was necessary to proceed with endovaginal exam following the transabdominal exam to visualize the uterus and ovaries. COMPARISON:  Prior CT from earlier same day. FINDINGS: Uterus Measurements: 8.7 x 4.6 x 5.1 cm. No fibroids or other mass visualized. Endometrium Thickness: 6.3 mm.  No focal abnormality visualized. Right ovary Measurements: 3.1 x 1.7 x 2.3  cm. Normal appearance/no adnexal mass. Left ovary Measurements: 5.1 x 3.6 x 4.3 cm. 3.4 x 3.4 x 3.5 cm complex left ovarian cyst. Internal lace-like architecture, consistent with a hemorrhagic cyst. No internal vascularity or other concerning features. Other findings No abnormal free fluid. IMPRESSION: 1. 3.4 x 3.4 x 3.5 cm complex left ovarian cyst, most consistent with a benign hemorrhagic cyst. No follow-up imaging is recommended regarding this lesion. This recommendation follows the consensus statement: Management of Asymptomatic Ovarian and Other Adnexal Cysts  Imaged at US: Society of Radiologists in Ultrasound Consensus Conference Statement. Radiology 2010; [id number]. 2. Otherwise unremarkable and normal ultrasound of the pelvis. Electronically Signed   By: Rise MuBenjamin  McClintock M.D.   On: 06/06/2017 05:55    ED COURSE  Nursing notes and initial vitals signs, including pulse oximetry, reviewed.  Vitals:   06/06/17 0419 06/06/17 0430 06/06/17 0516 06/06/17 0600  BP:  (!) 101/59 (!) 92/55 116/81  Pulse: 82 (!) 52 81 77  Resp:  18 18 20   Temp:      TempSrc:      SpO2:  99% 100% 100%  Weight:      Height:        PROCEDURES    ED DIAGNOSES     ICD-10-CM   1. Ovarian cyst N83.209 US PELVIC COMPLETE WITH TRANSVAGINAL    US PELVIC COMPLETE WITH TRANSVAGINAL  2. Cyst of left ovary N83.202 US PELVIC COMPLETE WITH TRANSVAGINAL       Loui Massenburg, MD 06/06/17 [id number]    Paula LibraMolpus, Karrine Kluttz, MD 06/06/17 [phone]

## 2017-06-06 NOTE — ED Triage Notes (Signed)
Pt reports having left sided flank pain that started appx 4 hr ago. Pt states pain radiates from back to abd.

## 2017-06-24 ENCOUNTER — Ambulatory Visit: Payer: Self-pay | Admitting: Obstetrics and Gynecology

## 2017-06-24 ENCOUNTER — Encounter: Payer: Self-pay | Admitting: Obstetrics and Gynecology

## 2017-06-24 VITALS — BP 111/76 | HR 70 | Wt 140.4 lb

## 2017-06-24 DIAGNOSIS — N83202 Unspecified ovarian cyst, left side: Secondary | ICD-10-CM | POA: Insufficient documentation

## 2017-06-24 MED ORDER — KETOROLAC TROMETHAMINE 10 MG PO TABS: 10.0000 mg | ORAL_TABLET | Freq: Four times a day (QID) | ORAL | 0 refills | Status: DC | PRN

## 2017-06-24 NOTE — Progress Notes (Signed)
Rescheduled US transvaginal per Dr. Vergie LivingPickens for 06/30/17 @ 9:30am.

## 2017-06-24 NOTE — Progress Notes (Signed)
Obstetrics and Gynecology New Patient Evaluation  Appointment Date: 06/24/2017  [facility]  Primary Care Provider: Patient, No Pcp Per  Referring Provider: ED  Chief Complaint: symptomatic LO cyst  History of Present Illness: Brooke CassetteMariah J Summerlin is a 26 y.o. African-American G1P1001 (Patient's last menstrual period was 06/13/2017 (exact date).), seen for the above chief complaint. Her past medical history is significant for remote h/o trich.    Patient went to ED after sudden onset pelvic pain starting the day before she presented to the ED on 12/2. No h/o LLQ pain such as this and no inciting events. Periods are always mid month and her last period prior to this was in mid November. Her periods are usually short and not that painful but are somewhat heavy but her one in December was heavy and painful which is different than before. She used some motrin this morning for her LLQ discomfort (pt unable to characterize) and that helped and she said that helped. She also uses zofran prn to help with occasional nausea.   No  fevers, chills, chest pain, SOB, nausea, abdominal pain, dysuria, hematuria, vaginal itching, diarrhea, constipation, blood in BMs  Review of Systems: as noted in the History of Present Illness.  Patient Active Problem List   Diagnosis Date Noted  . Left ovarian cyst 06/24/2017   Past Medical History:  Past Medical History:  Diagnosis Date  . Trichimoniasis     Past Surgical History:  Past Surgical History:  Procedure Laterality Date  . HAND SURGERY      Past Obstetrical History:  OB History  Gravida Para Term Preterm AB Living  1 1 1     1   SAB TAB Ectopic Multiple Live Births          1    # Outcome Date GA Lbr Len/2nd Weight Sex Delivery Anes PTL Lv  1 Term 06/28/10 [id number]   M Vag-Spont   LIV      Past Gynecological History: As per HPI. Periods: qmonth, regular, 2-3d and not painful but can be heavy History of Pap  Smear(s): Yes.   Last pap 2018, which was negative at the HD per patient History of STI(s): Yes.   She is currently using nothing for contraception.   Social History:  Social History   Socioeconomic History  . Marital status: Single    Spouse name: Not on file  . Number of children: Not on file  . Years of education: Not on file  . Highest education level: Not on file  Social Needs  . Financial resource strain: Not on file  . Food insecurity - worry: Not on file  . Food insecurity - inability: Not on file  . Transportation needs - medical: Not on file  . Transportation needs - non-medical: Not on file  Occupational History  . Not on file  Tobacco Use  . Smoking status: Never Smoker  . Smokeless tobacco: Never Used  Substance and Sexual Activity  . Alcohol use: No  . Drug use: No  . Sexual activity: Not on file  Other Topics Concern  . Not on file  Social History Narrative  . Not on file    Family History: No family history on file.  Medications Brooke CassetteMariah J. Hoes had no medications administered during this visit. Current Outpatient Medications  Medication Sig Dispense Refill  . ibuprofen (ADVIL,MOTRIN) 200 MG tablet Take 400 mg by mouth every 6 (six) hours as needed for moderate pain.    .Marland Kitchen  ondansetron (ZOFRAN ODT) 8 MG disintegrating tablet Take 1 tablet (8 mg total) by mouth every 8 (eight) hours as needed for nausea or vomiting. 10 tablet 0  . HYDROcodone-acetaminophen (NORCO) 5-325 MG tablet Take 1 tablet by mouth every 6 (six) hours as needed (for pain). (Patient not taking: Reported on 06/24/2017) 20 tablet 0  . magnesium citrate SOLN Take 1 Bottle by mouth once.     No current facility-administered medications for this visit.     Allergies Patient has no known allergies.   Physical Exam:  BP 111/76   Pulse 70   Wt 140 lb 6.4 oz (63.7 kg)   LMP 06/13/2017 (Exact Date)   BMI 21.99 kg/m  Body mass index is 21.99 kg/m. General appearance: Well nourished,  well developed female in no acute distress.  Cardiovascular: normal s1 and s2.  No murmurs, rubs or gallops. Respiratory:  Clear to auscultation bilateral. Normal respiratory effort Abdomen: positive bowel sounds and no masses, hernias; diffusely non tender to palpation, non distended Neuro/Psych:  Normal mood and affect.  Skin:  Warm and dry.  Lymphatic:  No inguinal lymphadenopathy.   Pelvic exam: deferred.   Laboratory: 12/2 UPT neg, u/a neg  Radiology:  CLINICAL DATA:  Initial evaluation for ovarian cyst seen on prior CT.  EXAM: TRANSABDOMINAL AND TRANSVAGINAL ULTRASOUND OF PELVIS  TECHNIQUE: Both transabdominal and transvaginal ultrasound examinations of the pelvis were performed. Transabdominal technique was performed for global imaging of the pelvis including uterus, ovaries, adnexal regions, and pelvic cul-de-sac. It was necessary to proceed with endovaginal exam following the transabdominal exam to visualize the uterus and ovaries.  COMPARISON:  Prior CT from earlier same day.  FINDINGS: Uterus  Measurements: 8.7 x 4.6 x 5.1 cm. No fibroids or other mass visualized.  Endometrium  Thickness: 6.3 mm.  No focal abnormality visualized.  Right ovary  Measurements: 3.1 x 1.7 x 2.3 cm. Normal appearance/no adnexal mass.  Left ovary  Measurements: 5.1 x 3.6 x 4.3 cm. 3.4 x 3.4 x 3.5 cm complex left ovarian cyst. Internal lace-like architecture, consistent with a hemorrhagic cyst. No internal vascularity or other concerning features.  Other findings  No abnormal free fluid.  IMPRESSION: 1. 3.4 x 3.4 x 3.5 cm complex left ovarian cyst, most consistent with a benign hemorrhagic cyst. No follow-up imaging is recommended regarding this lesion. This recommendation follows the consensus statement: Management of Asymptomatic Ovarian and Other Adnexal Cysts Imaged at US: Society of Radiologists in Ultrasound Consensus Conference Statement. Radiology  2010; [phone] [id number]. 2. Otherwise unremarkable and normal ultrasound of the pelvis.   Electronically Signed   By: Rise MuBenjamin  McClintock M.D.   On: 06/06/2017 05:55  Assessment: pt stable  Plan:  1. Left ovarian cyst D/w pt that is can rarely happen consistently but it's good that she hasn't ever had this before. Images reviewed and does seem c/w hemorrhagic cyst and no e/o torsion and it does line up to about halfway through her cycle which is also c/w a hemorrhagic cyst. Since having persisting but not severe pain, I won't wait for early cycle repeat u/s and get one in the next week. Will call pt and f/u results. ER precautions given - US Transvaginal Non-OB; Future  Orders Placed This Encounter  Procedures  . US Transvaginal Non-OB    RTC PRN  Cornelia Copaharlie Verlyn Lambert, Jr MD Attending [facility](Faculty Practice)

## 2017-06-30 ENCOUNTER — Ambulatory Visit (HOSPITAL_COMMUNITY)
Admission: RE | Admit: 2017-06-30 | Discharge: 2017-06-30 | Disposition: A | Discharge status: Home / Self Care | Payer: Self-pay | Source: Ambulatory Visit | Attending: Obstetrics and Gynecology | Admitting: Obstetrics and Gynecology

## 2017-06-30 DIAGNOSIS — N83202 Unspecified ovarian cyst, left side: Secondary | ICD-10-CM | POA: Insufficient documentation

## 2017-07-01 ENCOUNTER — Telehealth: Payer: Self-pay | Admitting: Obstetrics and Gynecology

## 2017-07-01 NOTE — Telephone Encounter (Signed)
GYN Telephone Note Patient called at [phone] and generic VM picked up. VM left for pt call office back  Brooke Mejia, Jr MD Attending [facility] ([facility]) 07/01/2017 Time: [phone]

## 2017-07-08 ENCOUNTER — Telehealth: Payer: Self-pay | Admitting: *Deleted

## 2017-07-08 NOTE — Telephone Encounter (Signed)
Received message left on nurse voicemail on 07/07/16 at 1333.  Patient states she is returning a call from Dr. Vergie LivingPickens regarding her U/S results.  Patient requests a return call to [phone].

## 2017-07-12 ENCOUNTER — Telehealth: Payer: Self-pay | Admitting: General Practice

## 2017-07-12 ENCOUNTER — Telehealth: Payer: Self-pay | Admitting: Obstetrics and Gynecology

## 2017-07-12 NOTE — Telephone Encounter (Signed)
GYN Telephone Note  Patient called at [phone] and d/w her re: results. S/s are resolving; pt would like to hold off on OCPs for now and see if s/s come back in the future. All questions asked and answered.  Brooke Mejia, Jr MD Attending [facility]) 07/12/2017 Time: [phone]

## 2017-07-12 NOTE — Telephone Encounter (Signed)
Patient called and left message on nurse line stating she has been calling for the past week for Dr Vergie LivingPickens. Patient states she missed a phone call from him for results. Called patient, no answer- left message on VM stating we are trying to reach you to return your phone call, please call us back

## 2017-07-12 NOTE — Telephone Encounter (Signed)
Patient called and left message on nurse line stating she is returning our call. Called patient & informed her of results. Patient verbalized understanding and asked how much longer until it's completely gone. Told patient probably a couple more weeks. Patient verbalized understanding and had no questions

## 2017-07-15 ENCOUNTER — Ambulatory Visit (HOSPITAL_COMMUNITY): Payer: Medicaid Other

## 2017-12-31 ENCOUNTER — Other Ambulatory Visit: Payer: Self-pay

## 2017-12-31 ENCOUNTER — Ambulatory Visit (INDEPENDENT_AMBULATORY_CARE_PROVIDER_SITE_OTHER): Payer: 59 | Admitting: Urgent Care

## 2017-12-31 ENCOUNTER — Encounter: Payer: Self-pay | Admitting: Urgent Care

## 2017-12-31 VITALS — BP 115/80 | HR 89 | Temp 98.8°F | Resp 16 | Ht 67.0 in | Wt 151.6 lb

## 2017-12-31 DIAGNOSIS — Z23 Encounter for immunization: Secondary | ICD-10-CM

## 2017-12-31 DIAGNOSIS — Z0000 Encounter for general adult medical examination without abnormal findings: Secondary | ICD-10-CM

## 2017-12-31 DIAGNOSIS — Z1321 Encounter for screening for nutritional disorder: Secondary | ICD-10-CM

## 2017-12-31 DIAGNOSIS — Z7251 High risk heterosexual behavior: Secondary | ICD-10-CM | POA: Diagnosis not present

## 2017-12-31 DIAGNOSIS — Z1329 Encounter for screening for other suspected endocrine disorder: Secondary | ICD-10-CM

## 2017-12-31 DIAGNOSIS — Z01419 Encounter for gynecological examination (general) (routine) without abnormal findings: Secondary | ICD-10-CM

## 2017-12-31 DIAGNOSIS — N898 Other specified noninflammatory disorders of vagina: Secondary | ICD-10-CM

## 2017-12-31 DIAGNOSIS — R87612 Low grade squamous intraepithelial lesion on cytologic smear of cervix (LGSIL): Secondary | ICD-10-CM | POA: Diagnosis not present

## 2017-12-31 DIAGNOSIS — Z113 Encounter for screening for infections with a predominantly sexual mode of transmission: Secondary | ICD-10-CM | POA: Diagnosis not present

## 2017-12-31 DIAGNOSIS — Z124 Encounter for screening for malignant neoplasm of cervix: Secondary | ICD-10-CM

## 2017-12-31 DIAGNOSIS — Z13228 Encounter for screening for other metabolic disorders: Secondary | ICD-10-CM

## 2017-12-31 DIAGNOSIS — N888 Other specified noninflammatory disorders of cervix uteri: Secondary | ICD-10-CM

## 2017-12-31 DIAGNOSIS — Z130 Encounter for screening for diseases of the blood and blood-forming organs and certain disorders involving the immune mechanism: Secondary | ICD-10-CM

## 2017-12-31 NOTE — Progress Notes (Signed)
MRN: [id number]  Subjective:   Ms. Brooke Mejia is a 27 y.o. female presenting for annual physical exam.  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  She is currently single, sexually active, has unprotected sex with a partner that has sex with other women.  She would like to have STI testing today.  Denies smoking cigarettes or drinking alcohol.  Medical care team includes: PCP: Patient, No Pcp Per Vision: Wears glasses, last eye exam 2 years ago. Has an appointment on 01/04/2018.  Dental: Cleanings every 6 months. OB/GYN: Patient had a Pap smear last year with the health department that was normal.  However she is concerned that she may have an infection and wants to be screened for STIs. Specialists: None.  Brooke Mejia is not currently taking any medications.  She has No Known Allergies. Brooke Mejia  has a past medical history of Trichimoniasis. Also  has a past surgical history that includes Hand surgery.  Family history is positive for heart disease with her mother, sickle cell disease with her brother, hyperlipidemia with her maternal grandmother.  Immunizations: Patient is to have her Tdap updated today.  Review of Systems  Constitutional: Negative for chills, diaphoresis, fever, malaise/fatigue and weight loss.  HENT: Negative for congestion, ear discharge, ear pain, hearing loss, nosebleeds, sore throat and tinnitus.   Eyes: Negative for blurred vision, double vision, photophobia, pain, discharge and redness.  Respiratory: Negative for cough, shortness of breath and wheezing.   Cardiovascular: Negative for chest pain, palpitations and leg swelling.  Gastrointestinal: Negative for abdominal pain, blood in stool, constipation, diarrhea, nausea and vomiting.  Genitourinary: Negative for dysuria, flank pain, frequency, hematuria and urgency.  Musculoskeletal: Negative for back pain, joint pain and myalgias.  Skin: Negative for itching and rash.  Neurological: Negative for dizziness, tingling,  seizures, loss of consciousness, weakness and headaches.  Endo/Heme/Allergies: Negative for polydipsia.  Psychiatric/Behavioral: Negative for depression, hallucinations, memory loss, substance abuse and suicidal ideas. The patient is not nervous/anxious and does not have insomnia.   Is Objective:   Vitals: BP 115/80   Pulse 89   Temp 98.8 F (37.1 C) (Oral)   Resp 16   Ht 5\' 7"  (1.702 m)   Wt 151 lb 9.6 oz (68.8 kg)   SpO2 98%   BMI 23.74 kg/m   Physical Exam  Constitutional: She is oriented to person, place, and time. She appears well-developed and well-nourished.  HENT:  TM's intact bilaterally, no effusions or erythema. Nasal turbinates pink and moist, nasal passages patent. No sinus tenderness. Oropharynx clear, mucous membranes moist, dentition in good repair.  Eyes: Pupils are equal, round, and reactive to light. Conjunctivae and EOM are normal. Right eye exhibits no discharge. Left eye exhibits no discharge. No scleral icterus.  Neck: Normal range of motion. Neck supple. No thyromegaly present.  Cardiovascular: Normal rate, regular rhythm and intact distal pulses. Exam reveals no gallop and no friction rub.  No murmur heard. Pulmonary/Chest: No respiratory distress. She has no wheezes. She has no rales.  Abdominal: Soft. Bowel sounds are normal. She exhibits no distension and no mass. There is no tenderness.  Genitourinary: No labial fusion. There is no rash, tenderness, lesion or injury on the right labia. There is no rash, tenderness, lesion or injury on the left labia. Uterus is not deviated, not enlarged, not fixed and not tender. Cervix exhibits discharge and friability. Cervix exhibits no motion tenderness. Right adnexum displays no mass, no tenderness and no fullness. Left adnexum displays no mass,  no tenderness and no fullness. No erythema, tenderness or bleeding in the vagina. No foreign body in the vagina. No signs of injury around the vagina. Vaginal discharge found.    Musculoskeletal: Normal range of motion. She exhibits no edema or tenderness.  Lymphadenopathy:    She has no cervical adenopathy.  Neurological: She is alert and oriented to person, place, and time. She has normal reflexes. She displays normal reflexes. Coordination normal.  Skin: Skin is warm and dry. No rash noted. No erythema. No pallor.  Psychiatric: She has a normal mood and affect.   Assessment and Plan :   Annual physical exam - Plan: CBC, Comprehensive metabolic panel, HIV antibody, Lipid panel, TSH  Screening for cervical cancer - Plan: Pap IG, CT/NG NAA, and HPV (high risk)  Routine screening for STI (sexually transmitted infection) - Plan: Pap IG, CT/NG NAA, and HPV (high risk), WET PREP FOR TRICH, YEAST, CLUE, GC/Chlamydia Probe Amp  Unprotected sex - Plan: Pap IG, CT/NG NAA, and HPV (high risk), RPR, Wet prep, genital  Vaginal discharge  Cervical discharge  Screening for endocrine, nutritional, metabolic and immunity disorder  Screening for deficiency anemia  Patient is medically stable, very pleasant, labs pending. Discussed healthy lifestyle, diet, exercise, preventative care, vaccinations, and addressed patient's concerns.  We will follow-up with lab results and treat as appropriate for any STIs.  Wallis Bamberg, PA-C Primary Care at [facility] [phone] 12/31/2017  2:49 PM

## 2017-12-31 NOTE — Patient Instructions (Addendum)
Health Maintenance, Female Adopting a healthy lifestyle and getting preventive care can go a long way to promote health and wellness. Talk with your health care provider about what schedule of regular examinations is right for you. This is a good chance for you to check in with your provider about disease prevention and staying healthy. In between checkups, there are plenty of things you can do on your own. Experts have done a lot of research about which lifestyle changes and preventive measures are most likely to keep you healthy. Ask your health care provider for more information. Weight and diet Eat a healthy diet  Be sure to include plenty of vegetables, fruits, low-fat dairy products, and lean protein.  Do not eat a lot of foods high in solid fats, added sugars, or salt.  Get regular exercise. This is one of the most important things you can do for your health. ? Most adults should exercise for at least 150 minutes each week. The exercise should increase your heart rate and make you sweat (moderate-intensity exercise). ? Most adults should also do strengthening exercises at least twice a week. This is in addition to the moderate-intensity exercise.  Maintain a healthy weight  Body mass index (BMI) is a measurement that can be used to identify possible weight problems. It estimates body fat based on height and weight. Your health care provider can help determine your BMI and help you achieve or maintain a healthy weight.  For females 20 years of age and older: ? A BMI below 18.5 is considered underweight. ? A BMI of 18.5 to 24.9 is normal. ? A BMI of 25 to 29.9 is considered overweight. ? A BMI of 30 and above is considered obese.  Watch levels of cholesterol and blood lipids  You should start having your blood tested for lipids and cholesterol at 26 years of age, then have this test every 5 years.  You may need to have your cholesterol levels checked more often if: ? Your lipid or  cholesterol levels are high. ? You are older than 26 years of age. ? You are at high risk for heart disease.  Cancer screening Lung Cancer  Lung cancer screening is recommended for adults 55-80 years old who are at high risk for lung cancer because of a history of smoking.  A yearly low-dose CT scan of the lungs is recommended for people who: ? Currently smoke. ? Have quit within the past 15 years. ? Have at least a 30-pack-year history of smoking. A pack year is smoking an average of one pack of cigarettes a day for 1 year.  Yearly screening should continue until it has been 15 years since you quit.  Yearly screening should stop if you develop a health problem that would prevent you from having lung cancer treatment.  Breast Cancer  Practice breast self-awareness. This means understanding how your breasts normally appear and feel.  It also means doing regular breast self-exams. Let your health care provider know about any changes, no matter how small.  If you are in your 20s or 30s, you should have a clinical breast exam (CBE) by a health care provider every 1-3 years as part of a regular health exam.  If you are 40 or older, have a CBE every year. Also consider having a breast X-ray (mammogram) every year.  If you have a family history of breast cancer, talk to your health care provider about genetic screening.  If you are at high risk   for breast cancer, talk to your health care provider about having an MRI and a mammogram every year.  Breast cancer gene (BRCA) assessment is recommended for women who have family members with BRCA-related cancers. BRCA-related cancers include: ? Breast. ? Ovarian. ? Tubal. ? Peritoneal cancers.  Results of the assessment will determine the need for genetic counseling and BRCA1 and BRCA2 testing.  Cervical Cancer Your health care provider may recommend that you be screened regularly for cancer of the pelvic organs (ovaries, uterus, and  vagina). This screening involves a pelvic examination, including checking for microscopic changes to the surface of your cervix (Pap test). You may be encouraged to have this screening done every 3 years, beginning at age 22.  For women ages 56-65, health care providers may recommend pelvic exams and Pap testing every 3 years, or they may recommend the Pap and pelvic exam, combined with testing for human papilloma virus (HPV), every 5 years. Some types of HPV increase your risk of cervical cancer. Testing for HPV may also be done on women of any age with unclear Pap test results.  Other health care providers may not recommend any screening for nonpregnant women who are considered low risk for pelvic cancer and who do not have symptoms. Ask your health care provider if a screening pelvic exam is right for you.  If you have had past treatment for cervical cancer or a condition that could lead to cancer, you need Pap tests and screening for cancer for at least 20 years after your treatment. If Pap tests have been discontinued, your risk factors (such as having a new sexual partner) need to be reassessed to determine if screening should resume. Some women have medical problems that increase the chance of getting cervical cancer. In these cases, your health care provider may recommend more frequent screening and Pap tests.  Colorectal Cancer  This type of cancer can be detected and often prevented.  Routine colorectal cancer screening usually begins at 26 years of age and continues through 26 years of age.  Your health care provider may recommend screening at an earlier age if you have risk factors for colon cancer.  Your health care provider may also recommend using home test kits to check for hidden blood in the stool.  A small camera at the end of a tube can be used to examine your colon directly (sigmoidoscopy or colonoscopy). This is done to check for the earliest forms of colorectal  cancer.  Routine screening usually begins at age 33.  Direct examination of the colon should be repeated every 5-10 years through 26 years of age. However, you may need to be screened more often if early forms of precancerous polyps or small growths are found.  Skin Cancer  Check your skin from head to toe regularly.  Tell your health care provider about any new moles or changes in moles, especially if there is a change in a mole's shape or color.  Also tell your health care provider if you have a mole that is larger than the size of a pencil eraser.  Always use sunscreen. Apply sunscreen liberally and repeatedly throughout the day.  Protect yourself by wearing long sleeves, pants, a wide-brimmed hat, and sunglasses whenever you are outside.  Heart disease, diabetes, and high blood pressure  High blood pressure causes heart disease and increases the risk of stroke. High blood pressure is more likely to develop in: ? People who have blood pressure in the high end of  the normal range (130-139/85-89 mm Hg). ? People who are overweight or obese. ? People who are African American.  If you are 21-29 years of age, have your blood pressure checked every 3-5 years. If you are 3 years of age or older, have your blood pressure checked every year. You should have your blood pressure measured twice-once when you are at a hospital or clinic, and once when you are not at a hospital or clinic. Record the average of the two measurements. To check your blood pressure when you are not at a hospital or clinic, you can use: ? An automated blood pressure machine at a pharmacy. ? A home blood pressure monitor.  If you are between 17 years and 37 years old, ask your health care provider if you should take aspirin to prevent strokes.  Have regular diabetes screenings. This involves taking a blood sample to check your fasting blood sugar level. ? If you are at a normal weight and have a low risk for diabetes,  have this test once every three years after 26 years of age. ? If you are overweight and have a high risk for diabetes, consider being tested at a younger age or more often. Preventing infection Hepatitis B  If you have a higher risk for hepatitis B, you should be screened for this virus. You are considered at high risk for hepatitis B if: ? You were born in a country where hepatitis B is common. Ask your health care provider which countries are considered high risk. ? Your parents were born in a high-risk country, and you have not been immunized against hepatitis B (hepatitis B vaccine). ? You have HIV or AIDS. ? You use needles to inject street drugs. ? You live with someone who has hepatitis B. ? You have had sex with someone who has hepatitis B. ? You get hemodialysis treatment. ? You take certain medicines for conditions, including cancer, organ transplantation, and autoimmune conditions.  Hepatitis C  Blood testing is recommended for: ? Everyone born from [birth year] through 1964. ? Anyone with known risk factors for hepatitis C.  Sexually transmitted infections (STIs)  You should be screened for sexually transmitted infections (STIs) including gonorrhea and chlamydia if: ? You are sexually active and are younger than 26 years of age. ? You are older than 26 years of age and your health care provider tells you that you are at risk for this type of infection. ? Your sexual activity has changed since you were last screened and you are at an increased risk for chlamydia or gonorrhea. Ask your health care provider if you are at risk.  If you do not have HIV, but are at risk, it may be recommended that you take a prescription medicine daily to prevent HIV infection. This is called pre-exposure prophylaxis (PrEP). You are considered at risk if: ? You are sexually active and do not regularly use condoms or know the HIV status of your partner(s). ? You take drugs by injection. ? You are  sexually active with a partner who has HIV.  Talk with your health care provider about whether you are at high risk of being infected with HIV. If you choose to begin PrEP, you should first be tested for HIV. You should then be tested every 3 months for as long as you are taking PrEP. Pregnancy  If you are premenopausal and you may become pregnant, ask your health care provider about preconception counseling.  If you may become  pregnant, take 400 to 800 micrograms (mcg) of folic acid every day.  If you want to prevent pregnancy, talk to your health care provider about birth control (contraception). Osteoporosis and menopause  Osteoporosis is a disease in which the bones lose minerals and strength with aging. This can result in serious bone fractures. Your risk for osteoporosis can be identified using a bone density scan.  If you are 74 years of age or older, or if you are at risk for osteoporosis and fractures, ask your health care provider if you should be screened.  Ask your health care provider whether you should take a calcium or vitamin D supplement to lower your risk for osteoporosis.  Menopause may have certain physical symptoms and risks.  Hormone replacement therapy may reduce some of these symptoms and risks. Talk to your health care provider about whether hormone replacement therapy is right for you. Follow these instructions at home:  Schedule regular health, dental, and eye exams.  Stay current with your immunizations.  Do not use any tobacco products including cigarettes, chewing tobacco, or electronic cigarettes.  If you are pregnant, do not drink alcohol.  If you are breastfeeding, limit how much and how often you drink alcohol.  Limit alcohol intake to no more than 1 drink per day for nonpregnant women. One drink equals 12 ounces of beer, 5 ounces of wine, or 1 ounces of hard liquor.  Do not use street drugs.  Do not share needles.  Ask your health care  provider for help if you need support or information about quitting drugs.  Tell your health care provider if you often feel depressed.  Tell your health care provider if you have ever been abused or do not feel safe at home. This information is not intended to replace advice given to you by your health care provider. Make sure you discuss any questions you have with your health care provider. Document Released: 01/05/2011 Document Revised: 11/28/2015 Document Reviewed: 03/26/2015 Elsevier Interactive Patient Education  2018 [employer].     IF you received an x-ray today, you will receive an invoice from [facility]. Please contact [facility] at [phone] with questions or concerns regarding your invoice.   IF you received labwork today, you will receive an invoice from [place]. Please contact [facility] at [phone] with questions or concerns regarding your invoice.   Our billing staff will not be able to assist you with questions regarding bills from these companies.  You will be contacted with the lab results as soon as they are available. The fastest way to get your results is to activate your My Chart account. Instructions are located on the last page of this paperwork. If you have not heard from [place] regarding the results in 2 weeks, please contact this office.

## 2018-01-01 LAB — CBC
HEMATOCRIT: 35.8 % (ref 34.0–46.6)
Hemoglobin: 11.9 g/dL (ref 11.1–15.9)
MCH: 31.6 pg (ref 26.6–33.0)
MCHC: 33.2 g/dL (ref 31.5–35.7)
MCV: 95 fL (ref 79–97)
PLATELETS: 243 10*3/uL (ref 150–450)
RBC: 3.76 x10E6/uL — AB (ref 3.77–5.28)
RDW: 13.8 % (ref 12.3–15.4)
WBC: 7.3 10*3/uL (ref 3.4–10.8)

## 2018-01-01 LAB — WET PREP FOR TRICH, YEAST, CLUE
Clue Cell Exam: NEGATIVE
Trichomonas Exam: NEGATIVE
YEAST EXAM: NEGATIVE

## 2018-01-01 LAB — COMPREHENSIVE METABOLIC PANEL
A/G RATIO: 1.6 (ref 1.2–2.2)
ALT: 10 IU/L (ref 0–32)
AST: 15 IU/L (ref 0–40)
Albumin: 4.5 g/dL (ref 3.5–5.5)
Alkaline Phosphatase: 52 IU/L (ref 39–117)
BILIRUBIN TOTAL: 1 mg/dL (ref 0.0–1.2)
BUN / CREAT RATIO: 11 (ref 9–23)
BUN: 9 mg/dL (ref 6–20)
CALCIUM: 9.4 mg/dL (ref 8.7–10.2)
CO2: 20 mmol/L (ref 20–29)
Chloride: 107 mmol/L — ABNORMAL HIGH (ref 96–106)
Creatinine, Ser: 0.82 mg/dL (ref 0.57–1.00)
GFR, EST AFRICAN AMERICAN: 114 mL/min/{1.73_m2} (ref >59)
GFR, EST NON AFRICAN AMERICAN: 99 mL/min/{1.73_m2} (ref >59)
GLOBULIN, TOTAL: 2.9 g/dL (ref 1.5–4.5)
Glucose: 73 mg/dL (ref 65–99)
Potassium: 3.9 mmol/L (ref 3.5–5.2)
Sodium: 139 mmol/L (ref 134–144)
TOTAL PROTEIN: 7.4 g/dL (ref 6.0–8.5)

## 2018-01-01 LAB — TSH: TSH: 0.766 u[IU]/mL (ref 0.450–4.500)

## 2018-01-01 LAB — LIPID PANEL
CHOL/HDL RATIO: 2.2 ratio (ref 0.0–4.4)
Cholesterol, Total: 140 mg/dL (ref 100–199)
HDL: 63 mg/dL (ref >39)
LDL CALC: 63 mg/dL (ref 0–99)
TRIGLYCERIDES: 71 mg/dL (ref 0–149)
VLDL CHOLESTEROL CAL: 14 mg/dL (ref 5–40)

## 2018-01-01 LAB — RPR: RPR Ser Ql: NONREACTIVE

## 2018-01-01 LAB — HIV ANTIBODY (ROUTINE TESTING W REFLEX): HIV Screen 4th Generation wRfx: NONREACTIVE

## 2018-01-02 LAB — GC/CHLAMYDIA PROBE AMP
Chlamydia trachomatis, NAA: NEGATIVE
NEISSERIA GONORRHOEAE BY PCR: NEGATIVE

## 2018-01-05 ENCOUNTER — Encounter: Payer: Self-pay | Admitting: Emergency Medicine

## 2018-01-05 ENCOUNTER — Ambulatory Visit (INDEPENDENT_AMBULATORY_CARE_PROVIDER_SITE_OTHER): Payer: 59

## 2018-01-05 ENCOUNTER — Ambulatory Visit (INDEPENDENT_AMBULATORY_CARE_PROVIDER_SITE_OTHER): Payer: 59 | Admitting: Emergency Medicine

## 2018-01-05 VITALS — BP 107/71 | HR 85 | Temp 98.9°F | Resp 17 | Ht 67.0 in | Wt 154.0 lb

## 2018-01-05 DIAGNOSIS — M546 Pain in thoracic spine: Secondary | ICD-10-CM | POA: Diagnosis not present

## 2018-01-05 DIAGNOSIS — M542 Cervicalgia: Secondary | ICD-10-CM | POA: Diagnosis not present

## 2018-01-05 DIAGNOSIS — R079 Chest pain, unspecified: Secondary | ICD-10-CM | POA: Diagnosis not present

## 2018-01-05 DIAGNOSIS — S29019A Strain of muscle and tendon of unspecified wall of thorax, initial encounter: Secondary | ICD-10-CM

## 2018-01-05 DIAGNOSIS — S161XXA Strain of muscle, fascia and tendon at neck level, initial encounter: Secondary | ICD-10-CM | POA: Diagnosis not present

## 2018-01-05 MED ORDER — TRAMADOL HCL 50 MG PO TABS: 50.0000 mg | ORAL_TABLET | Freq: Three times a day (TID) | ORAL | 0 refills | Status: DC | PRN

## 2018-01-05 MED ORDER — CYCLOBENZAPRINE HCL 10 MG PO TABS: 10.0000 mg | ORAL_TABLET | Freq: Every day | ORAL | 0 refills | Status: DC

## 2018-01-05 NOTE — Patient Instructions (Addendum)
   IF you received an x-ray today, you will receive an invoice from [facility]. Please contact [facility] at [phone] with questions or concerns regarding your invoice.   IF you received labwork today, you will receive an invoice from [facility]. Please contact [facility] at [phone] with questions or concerns regarding your invoice.   Our billing staff will not be able to assist you with questions regarding bills from these companies.  You will be contacted with the lab results as soon as they are available. The fastest way to get your results is to activate your My Chart account. Instructions are located on the last page of this paperwork. If you have not heard from us regarding the results in 2 weeks, please contact this office.      Cervical Sprain A cervical sprain is a stretch or tear in the tissues that connect bones (ligaments) in the neck. Most neck (cervical) sprains get better in 4-6 weeks. Follow these instructions at home: If you have a neck collar:  Wear it as told by your doctor. Do not take off (do not remove) the collar unless your doctor says that this is safe.  Ask your doctor before adjusting your collar.  If you have long hair, keep it outside of the collar.  Ask your doctor if you may take off the collar for cleaning and bathing. If you may take off the collar: ? Follow instructions from your doctor about how to take off the collar safely. ? Clean the collar by wiping it with mild soap and water. Let it air-dry all the way. ? If your collar has removable pads:  Take the pads out every 1-2 days.  Hand wash the pads with soap and water.  Let the pads air-dry all the way before you put them back in the collar. Do not dry them in a clothes dryer. Do not dry them with a hair dryer. ? Check your skin under the collar for irritation or sores. If you see any, tell your doctor. Managing pain, stiffness, and swelling  Use a cervical traction  device, if told by your doctor.  If told, put heat on the affected area. Do this before exercises (physical therapy) or as often as told by your doctor. Use the heat source that your doctor recommends, such as a moist heat pack or a heating pad. ? Place a towel between your skin and the heat source. ? Leave the heat on for 20-30 minutes. ? Take the heat off (remove the heat) if your skin turns bright red. This is very important if you cannot feel pain, heat, or cold. You may have a greater risk of getting burned.  Put ice on the affected area. ? Put ice in a plastic bag. ? Place a towel between your skin and the bag. ? Leave the ice on for 20 minutes, 2-3 times a day. Activity  Do not drive while wearing a neck collar. If you do not have a neck collar, ask your doctor if it is safe to drive.  Do not drive or use heavy machinery while taking prescription pain medicine or muscle relaxants, unless your doctor approves.  Do not lift anything that is heavier than 10 lb (4.5 kg) until your doctor tells you that it is safe.  Rest as told by your doctor.  Avoid activities that make you feel worse. Ask your doctor what activities are safe for you.  Do exercises as told by your doctor or physical   therapist. Preventing neck sprain  Practice good posture. Adjust your workstation to help with this, if needed.  Exercise regularly as told by your doctor or physical therapist.  Avoid activities that are risky or may cause a neck sprain (cervical sprain). General instructions  Take over-the-counter and prescription medicines only as told by your doctor.  Do not use any products that contain nicotine or tobacco. This includes cigarettes and e-cigarettes. If you need help quitting, ask your doctor.  Keep all follow-up visits as told by your doctor. This is important. Contact a doctor if:  You have pain or other symptoms that get worse.  You have symptoms that do not get better after 2  weeks.  You have pain that does not get better with medicine.  You start to have new, unexplained symptoms.  You have sores or irritated skin from wearing your neck collar. Get help right away if:  You have very bad pain.  You have any of the following in any part of your body: ? Loss of feeling (numbness). ? Tingling. ? Weakness.  You cannot move a part of your body (you have paralysis).  Your activity level does not improve. Summary  A cervical sprain is a stretch or tear in the tissues that connect bones (ligaments) in the neck.  If you have a neck (cervical) collar, do not take off the collar unless your doctor says that this is safe.  Put ice on affected areas as told by your doctor.  Put heat on affected areas as told by your doctor.  Good posture and regular exercise can help prevent a neck sprain from happening again. This information is not intended to replace advice given to you by your health care provider. Make sure you discuss any questions you have with your health care provider. Document Released: 12/09/2007 Document Revised: 03/03/2016 Document Reviewed: 03/03/2016 Elsevier Interactive Patient Education  2017 [employer]  
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
## 2018-01-07 ENCOUNTER — Other Ambulatory Visit: Payer: Self-pay | Admitting: Urgent Care

## 2018-01-07 DIAGNOSIS — R87612 Low grade squamous intraepithelial lesion on cytologic smear of cervix (LGSIL): Secondary | ICD-10-CM

## 2018-01-07 DIAGNOSIS — R8789 Other abnormal findings in specimens from female genital organs: Secondary | ICD-10-CM

## 2018-01-07 DIAGNOSIS — R87618 Other abnormal cytological findings on specimens from cervix uteri: Secondary | ICD-10-CM

## 2018-01-07 LAB — PAP IG, CT-NG NAA, HPV HIGH-RISK
CHLAMYDIA, NUC. ACID AMP: NEGATIVE
GONOCOCCUS BY NUCLEIC ACID AMP: NEGATIVE
HPV, high-risk: POSITIVE — AB
PAP Smear Comment: 0

## 2018-01-18 DIAGNOSIS — R87612 Low grade squamous intraepithelial lesion on cytologic smear of cervix (LGSIL): Secondary | ICD-10-CM | POA: Diagnosis not present

## 2018-01-18 DIAGNOSIS — R87810 Cervical high risk human papillomavirus (HPV) DNA test positive: Secondary | ICD-10-CM | POA: Diagnosis not present

## 2018-03-02 DIAGNOSIS — Z3202 Encounter for pregnancy test, result negative: Secondary | ICD-10-CM | POA: Diagnosis not present

## 2018-03-02 DIAGNOSIS — N879 Dysplasia of cervix uteri, unspecified: Secondary | ICD-10-CM | POA: Diagnosis not present

## 2018-03-02 DIAGNOSIS — N72 Inflammatory disease of cervix uteri: Secondary | ICD-10-CM | POA: Diagnosis not present

## 2018-03-02 DIAGNOSIS — R87619 Unspecified abnormal cytological findings in specimens from cervix uteri: Secondary | ICD-10-CM | POA: Insufficient documentation

## 2018-05-10 DIAGNOSIS — H5213 Myopia, bilateral: Secondary | ICD-10-CM | POA: Diagnosis not present

## 2018-05-10 DIAGNOSIS — H52223 Regular astigmatism, bilateral: Secondary | ICD-10-CM | POA: Diagnosis not present

## 2018-09-21 ENCOUNTER — Encounter: Payer: Self-pay | Admitting: Family Medicine

## 2018-09-21 ENCOUNTER — Ambulatory Visit (INDEPENDENT_AMBULATORY_CARE_PROVIDER_SITE_OTHER): Payer: 59 | Admitting: Family Medicine

## 2018-09-21 ENCOUNTER — Other Ambulatory Visit: Payer: Self-pay

## 2018-09-21 VITALS — BP 106/70 | HR 58 | Temp 98.2°F | Ht 67.0 in | Wt 157.6 lb

## 2018-09-21 DIAGNOSIS — R0789 Other chest pain: Secondary | ICD-10-CM

## 2018-09-21 DIAGNOSIS — N920 Excessive and frequent menstruation with regular cycle: Secondary | ICD-10-CM

## 2018-09-21 NOTE — Patient Instructions (Signed)
° ° ° °  If you have lab work done today you will be contacted with your lab results within the next 2 weeks.  If you have not heard from us then please contact us. The fastest way to get your results is to register for My Chart. ° ° °IF you received an x-ray today, you will receive an invoice from [facility]. Please contact [facility] at [phone] with questions or concerns regarding your invoice.  ° °IF you received labwork today, you will receive an invoice from [facility]. Please contact [facility] at [phone] with questions or concerns regarding your invoice.  ° °Our billing staff will not be able to assist you with questions regarding bills from these companies. ° °You will be contacted with the lab results as soon as they are available. The fastest way to get your results is to activate your My Chart account. Instructions are located on the last page of this paperwork. If you have not heard from us regarding the results in 2 weeks, please contact this office. °  ° ° ° °

## 2018-09-21 NOTE — Progress Notes (Signed)
   3/18/20209:27 AM  Brooke Mejia [birth year], 28 y.o. female [id number]  Chief Complaint  Patient presents with  . left breast pain    last week worse. going off and on last 3 weeks   . heavy menstral cycles    obgyn knows about it (real heavy the first 2 days)    HPI:   Patient is a 28 y.o. female who presents today for left breast pain and heavy periods  Having intermittent left breast pain, sharp x 3 weeks She did a self exam and did not fell lumps She denies any breast enlargement, redness and warmth Periods are heavy, no clots, having to change tampon every hour, last 2-3 days, gets her really nauseous since November 2019, prior to that normal Used to be on mirena - misplacedm changed to OCPs last BC a year ago LMP 09/16/2018 Currently not sexually active No fhx or breast or ovarian cancer   Fall Risk  09/21/2018 09/21/2018 01/05/2018  Falls in the past year? 0 0 No  Number falls in past yr: 0 0 -  Injury with Fall? 0 0 -  Follow up - Falls evaluation completed -     Depression screen [facility] 2/9 09/21/2018 01/05/2018 12/31/2017  Decreased Interest 0 0 0  Down, Depressed, Hopeless 0 0 0  PHQ - 2 Score 0 0 0    No Known Allergies  Prior to Admission medications   Medication Sig Start Date End Date Taking? Authorizing Provider  ibuprofen (ADVIL,MOTRIN) 200 MG tablet Take 400 mg by mouth every 6 (six) hours as needed.   Yes [provider]    Past Medical History:  Diagnosis Date  . Trichimoniasis     Past Surgical History:  Procedure Laterality Date  . HAND SURGERY      Social History   Tobacco Use  . Smoking status: Never Smoker  . Smokeless tobacco: Never Used  Substance Use Topics  . Alcohol use: No    No family history on file.  ROS Per hpi  OBJECTIVE:  Blood pressure 106/70, pulse (!) 58, temperature 98.2 F (36.8 C), temperature source Oral, height 5\' 7"  (1.702 m), weight 157 lb 9.6 oz (71.5 kg), last menstrual period 09/16/2018, SpO2  94 %. Body mass index is 24.68 kg/m.   Physical Exam  Gen: AAOx3, NAD Breasts; symmetrical, no masses, no skin changes, no nipple discharge Lymph: no axillary, supra or infraclavicular LN Chest wall: TTP over Left pectoral muscles  ASSESSMENT and PLAN  1. Left-sided chest wall pain Discussed supportive measures. Reassurance provided  2. Menorrhagia with regular cycle Patient declines OCP at this time  Return if symptoms worsen or fail to improve.    Myles Lipps, MD [facility] [address] [place], [place] [address] Ph.  [phone] Fax [phone]

## 2018-09-22 ENCOUNTER — Ambulatory Visit: Payer: 59 | Admitting: Emergency Medicine

## 2019-01-24 ENCOUNTER — Other Ambulatory Visit: Payer: Self-pay

## 2019-01-24 ENCOUNTER — Encounter: Payer: Self-pay | Admitting: Emergency Medicine

## 2019-01-24 ENCOUNTER — Ambulatory Visit (INDEPENDENT_AMBULATORY_CARE_PROVIDER_SITE_OTHER): Payer: 59

## 2019-01-24 ENCOUNTER — Ambulatory Visit (INDEPENDENT_AMBULATORY_CARE_PROVIDER_SITE_OTHER): Payer: 59 | Admitting: Emergency Medicine

## 2019-01-24 VITALS — BP 114/76 | HR 74 | Temp 99.0°F | Resp 16 | Wt 153.0 lb

## 2019-01-24 DIAGNOSIS — M7918 Myalgia, other site: Secondary | ICD-10-CM | POA: Diagnosis not present

## 2019-01-24 DIAGNOSIS — S40012A Contusion of left shoulder, initial encounter: Secondary | ICD-10-CM | POA: Diagnosis not present

## 2019-01-24 DIAGNOSIS — M62838 Other muscle spasm: Secondary | ICD-10-CM

## 2019-01-24 MED ORDER — CYCLOBENZAPRINE HCL 10 MG PO TABS: 10.0000 mg | ORAL_TABLET | Freq: Three times a day (TID) | ORAL | 0 refills | Status: DC | PRN

## 2019-01-24 MED ORDER — MELOXICAM 7.5 MG PO TABS: 7.5000 mg | ORAL_TABLET | Freq: Every day | ORAL | 0 refills | Status: DC
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
## 2019-01-24 NOTE — Patient Instructions (Addendum)
   If you have lab work done today you will be contacted with your lab results within the next 2 weeks.  If you have not heard from us then please contact us. The fastest way to get your results is to register for My Chart.   IF you received an x-ray today, you will receive an invoice from [facility]. Please contact [facility] at [phone] with questions or concerns regarding your invoice.   IF you received labwork today, you will receive an invoice from [facility]. Please contact [facility] at [phone] with questions or concerns regarding your invoice.   Our billing staff will not be able to assist you with questions regarding bills from these companies.  You will be contacted with the lab results as soon as they are available. The fastest way to get your results is to activate your My Chart account. Instructions are located on the last page of this paperwork. If you have not heard from us regarding the results in 2 weeks, please contact this office.     Shoulder Pain Many things can cause shoulder pain, including:  An injury.  Moving the shoulder in the same way again and again (overuse).  Joint pain (arthritis). Pain can come from:  Swelling and irritation (inflammation) of any part of the shoulder.  An injury to the shoulder joint.  An injury to: ? Tissues that connect muscle to bone (tendons). ? Tissues that connect bones to each other (ligaments). ? Bones. Follow these instructions at home: Watch for changes in your symptoms. Let your doctor know about them. Follow these instructions to help with your pain. If you have a sling:  Wear the sling as told by your doctor. Remove it only as told by your doctor.  Loosen the sling if your fingers: ? Tingle. ? Become numb. ? Turn cold and blue.  Keep the sling clean.  If the sling is not waterproof: ? Do not let it get wet. ? Take the sling off when you shower or bathe. Managing pain, stiffness,  and swelling   If told, put ice on the painful area: ? Put ice in a plastic bag. ? Place a towel between your skin and the bag. ? Leave the ice on for 20 minutes, 2-3 times a day. Stop putting ice on if it does not help with the pain.  Squeeze a soft ball or a foam pad as much as possible. This prevents swelling in the shoulder. It also helps to strengthen the arm. General instructions  Take over-the-counter and prescription medicines only as told by your doctor.  Keep all follow-up visits as told by your doctor. This is important. Contact a doctor if:  Your pain gets worse.  Medicine does not help your pain.  You have new pain in your arm, hand, or fingers. Get help right away if:  Your arm, hand, or fingers: ? Tingle. ? Are numb. ? Are swollen. ? Are painful. ? Turn white or blue. Summary  Shoulder pain can be caused by many things. These include injury, moving the shoulder in the same away again and again, and joint pain.  Watch for changes in your symptoms. Let your doctor know about them.  This condition may be treated with a sling, ice, and pain medicine.  Contact your doctor if the pain gets worse or you have new pain. Get help right away if your arm, hand, or fingers tingle or get numb, swollen, or painful.  Keep all follow-up visits   as told by your doctor. This is important. This information is not intended to replace advice given to you by your health care provider. Make sure you discuss any questions you have with your health care provider. Document Released: 12/09/2007 Document Revised: 01/04/2018 Document Reviewed: 01/04/2018 Elsevier Patient Education  2020 Elsevier Inc.  

## 2019-01-25 DIAGNOSIS — Z6824 Body mass index (BMI) 24.0-24.9, adult: Secondary | ICD-10-CM | POA: Diagnosis not present

## 2019-01-25 DIAGNOSIS — Z3009 Encounter for other general counseling and advice on contraception: Secondary | ICD-10-CM | POA: Diagnosis not present

## 2019-01-25 DIAGNOSIS — N946 Dysmenorrhea, unspecified: Secondary | ICD-10-CM | POA: Diagnosis not present

## 2019-01-25 DIAGNOSIS — Z1389 Encounter for screening for other disorder: Secondary | ICD-10-CM | POA: Diagnosis not present

## 2019-01-25 DIAGNOSIS — Z01419 Encounter for gynecological examination (general) (routine) without abnormal findings: Secondary | ICD-10-CM | POA: Diagnosis not present

## 2019-01-25 DIAGNOSIS — Z130 Encounter for screening for diseases of the blood and blood-forming organs and certain disorders involving the immune mechanism: Secondary | ICD-10-CM | POA: Diagnosis not present

## 2019-01-25 DIAGNOSIS — Z113 Encounter for screening for infections with a predominantly sexual mode of transmission: Secondary | ICD-10-CM | POA: Diagnosis not present

## 2019-01-25 DIAGNOSIS — Z202 Contact with and (suspected) exposure to infections with a predominantly sexual mode of transmission: Secondary | ICD-10-CM | POA: Diagnosis not present

## 2019-01-25 DIAGNOSIS — R87619 Unspecified abnormal cytological findings in specimens from cervix uteri: Secondary | ICD-10-CM | POA: Diagnosis not present

## 2019-01-26 DIAGNOSIS — Z1151 Encounter for screening for human papillomavirus (HPV): Secondary | ICD-10-CM | POA: Diagnosis not present

## 2019-01-26 DIAGNOSIS — R87619 Unspecified abnormal cytological findings in specimens from cervix uteri: Secondary | ICD-10-CM | POA: Diagnosis not present

## 2019-01-26 LAB — HM PAP SMEAR: HM Pap smear: NORMAL
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
## 2020-01-30 DIAGNOSIS — N939 Abnormal uterine and vaginal bleeding, unspecified: Secondary | ICD-10-CM | POA: Diagnosis not present

## 2020-01-30 DIAGNOSIS — Z130 Encounter for screening for diseases of the blood and blood-forming organs and certain disorders involving the immune mechanism: Secondary | ICD-10-CM | POA: Diagnosis not present

## 2020-01-30 DIAGNOSIS — Z01419 Encounter for gynecological examination (general) (routine) without abnormal findings: Secondary | ICD-10-CM | POA: Diagnosis not present

## 2020-01-30 DIAGNOSIS — Z202 Contact with and (suspected) exposure to infections with a predominantly sexual mode of transmission: Secondary | ICD-10-CM | POA: Diagnosis not present

## 2020-01-30 DIAGNOSIS — Z6825 Body mass index (BMI) 25.0-25.9, adult: Secondary | ICD-10-CM | POA: Diagnosis not present

## 2020-01-30 DIAGNOSIS — Z1389 Encounter for screening for other disorder: Secondary | ICD-10-CM | POA: Diagnosis not present

## 2020-01-30 DIAGNOSIS — Z113 Encounter for screening for infections with a predominantly sexual mode of transmission: Secondary | ICD-10-CM | POA: Diagnosis not present

## 2020-02-02 DIAGNOSIS — N939 Abnormal uterine and vaginal bleeding, unspecified: Secondary | ICD-10-CM | POA: Diagnosis not present

## 2020-03-12 DIAGNOSIS — Z23 Encounter for immunization: Secondary | ICD-10-CM | POA: Diagnosis not present

## 2020-05-06 DIAGNOSIS — N939 Abnormal uterine and vaginal bleeding, unspecified: Secondary | ICD-10-CM | POA: Diagnosis not present

## 2020-05-06 DIAGNOSIS — Z8619 Personal history of other infectious and parasitic diseases: Secondary | ICD-10-CM | POA: Diagnosis not present

## 2020-08-28 DIAGNOSIS — H52223 Regular astigmatism, bilateral: Secondary | ICD-10-CM | POA: Diagnosis not present

## 2020-08-28 DIAGNOSIS — H5213 Myopia, bilateral: Secondary | ICD-10-CM | POA: Diagnosis not present

## 2021-02-06 DIAGNOSIS — Z3009 Encounter for other general counseling and advice on contraception: Secondary | ICD-10-CM | POA: Diagnosis not present

## 2021-02-06 DIAGNOSIS — Z113 Encounter for screening for infections with a predominantly sexual mode of transmission: Secondary | ICD-10-CM | POA: Diagnosis not present

## 2021-02-06 DIAGNOSIS — N760 Acute vaginitis: Secondary | ICD-10-CM | POA: Diagnosis not present

## 2021-02-06 DIAGNOSIS — N898 Other specified noninflammatory disorders of vagina: Secondary | ICD-10-CM | POA: Diagnosis not present

## 2021-02-06 DIAGNOSIS — Z01419 Encounter for gynecological examination (general) (routine) without abnormal findings: Secondary | ICD-10-CM | POA: Diagnosis not present

## 2021-02-06 DIAGNOSIS — Z130 Encounter for screening for diseases of the blood and blood-forming organs and certain disorders involving the immune mechanism: Secondary | ICD-10-CM | POA: Diagnosis not present

## 2021-02-06 DIAGNOSIS — Z202 Contact with and (suspected) exposure to infections with a predominantly sexual mode of transmission: Secondary | ICD-10-CM | POA: Diagnosis not present

## 2021-02-06 DIAGNOSIS — Z1389 Encounter for screening for other disorder: Secondary | ICD-10-CM | POA: Diagnosis not present

## 2021-02-06 DIAGNOSIS — Z6824 Body mass index (BMI) 24.0-24.9, adult: Secondary | ICD-10-CM | POA: Diagnosis not present

## 2021-05-15 ENCOUNTER — Other Ambulatory Visit: Payer: Self-pay

## 2021-05-15 ENCOUNTER — Ambulatory Visit (INDEPENDENT_AMBULATORY_CARE_PROVIDER_SITE_OTHER): Payer: 59 | Admitting: Family

## 2021-05-15 ENCOUNTER — Encounter: Payer: Self-pay | Admitting: Family

## 2021-05-15 VITALS — BP 107/70 | HR 103 | Temp 98.2°F | Ht 67.0 in | Wt 155.4 lb

## 2021-05-15 DIAGNOSIS — F321 Major depressive disorder, single episode, moderate: Secondary | ICD-10-CM | POA: Diagnosis not present

## 2021-05-15 DIAGNOSIS — R519 Headache, unspecified: Secondary | ICD-10-CM

## 2021-05-15 MED ORDER — VENLAFAXINE HCL ER 37.5 MG PO CP24: 37.5000 mg | ORAL_CAPSULE | Freq: Every day | ORAL | 0 refills | Status: DC

## 2021-05-15 NOTE — Patient Instructions (Signed)
Welcome to [facility]! It was a pleasure meeting you today.  As discussed, I have sent Effexor (Venlafaxine) to your pharmacy. You can start this today or tomorrow morning. Please schedule a 1 month follow up visit today.   PLEASE NOTE:  If you had any LAB tests please let us know if you have not heard back within a few days. You may see your results on MyChart before we have a chance to review them but we will give you a call once they are reviewed by [place]. If we ordered any REFERRALS today, please let us know if you have not heard from their office within the next week.  Let us know through MyChart if you are needing REFILLS, or have your pharmacy send [place] the request. You can also use MyChart to communicate with me or any office staff.  Please try these tips to maintain a healthy lifestyle:  Eat most of your calories during the day when you are active. Eliminate processed foods including packaged sweets (pies, cakes, cookies), reduce intake of potatoes, white bread, white pasta, and white rice. Look for whole grain options, oat flour or almond flour.  Each meal should contain half fruits/vegetables, one quarter protein, and one quarter carbs (no bigger than a computer mouse).  Cut down on sweet beverages. This includes juice, soda, and sweet tea. Also watch fruit intake, though this is a healthier sweet option, it still contains natural sugar! Limit to 3 servings daily.  Drink at least 1 glass of water with each meal and aim for at least 8 glasses per day  Exercise at least 150 minutes every week.

## 2021-05-15 NOTE — Assessment & Plan Note (Signed)
Started a few months ago, have increased in frequency, denies prodromal sx, not migraine-like. Possibly r/t lack of sleep and current depression. Starting medication for depression which has headache preventative use also. Pt will f/u in 1 month.

## 2021-05-15 NOTE — Progress Notes (Signed)
New Patient Office Visit  Subjective:  Patient ID: Brooke Mejia, female    DOB: March 22, 1991  Age: 30 y.o. MRN: [id number]  CC:  Chief Complaint  Patient presents with   Annual Exam   Depression    Pt states that she has had CP on and off since the summer especially when she is stressed.   Headache    Has begin to get a lot lately.    HPI Brooke Mejia presents to establish care and to discuss a couple of acute problems. Headache: Patient complains of headaches occurring almost daily. She does not have a headache at this time. She reports a dull aching that gradually starts at any time of the day and slowly worsens. She takes tylenol or Ibuprofen which eases HA but then it returns. She reports having poor sleep, feeling more depressed, and is a single mom, working full time and taking college classes. She denies nausea or prodromal sx, HA does not occur peri-menstrually.  Anxiety/Depression: Patient complains of  depression .   She has the following symptoms: difficulty concentrating, fatigue, feelings of losing control, insomnia, irritable.  Onset of symptoms was approximately 3 years ago, She denies current suicidal and homicidal ideation.  Possible organic causes contributing are: none.  Risk factors: none  Previous treatment includes  counseling  and individual therapy.  She complains of the following side effects from the treatment:  not effective, pt felt therapist did not listen to her .  Depression screen PHQ 2/9 05/15/2021  Decreased Interest 3  Down, Depressed, Hopeless 2  PHQ - 2 Score 5  Altered sleeping 3  Tired, decreased energy 3  Change in appetite 2  Feeling bad or failure about yourself  3  Trouble concentrating 3  Moving slowly or fidgety/restless 1  Suicidal thoughts 2  PHQ-9 Score 22  Difficult doing work/chores Extremely dIfficult   GAD 7 : Generalized Anxiety Score 05/15/2021  Nervous, Anxious, on Edge 2  Control/stop worrying 3  Worry too much -  different things 3  Trouble relaxing 3  Restless 0  Easily annoyed or irritable 3  Afraid - awful might happen 3  Total GAD 7 Score 17  Anxiety Difficulty Extremely difficult     Past Medical History:  Diagnosis Date   Trichimoniasis     Past Surgical History:  Procedure Laterality Date   HAND SURGERY      History reviewed. No pertinent family history.  Social History   Socioeconomic History   Marital status: Single    Spouse name: Not on file   Number of children: Not on file   Years of education: Not on file   Highest education level: Not on file  Occupational History   Not on file  Tobacco Use   Smoking status: Never   Smokeless tobacco: Never  Substance and Sexual Activity   Alcohol use: No   Drug use: No   Sexual activity: Not on file  Other Topics Concern   Not on file  Social History Narrative   Not on file   Social Determinants of Health   Financial Resource Strain: Not on file  Food Insecurity: Not on file  Transportation Needs: Not on file  Physical Activity: Not on file  Stress: Not on file  Social Connections: Not on file  Intimate Partner Violence: Not on file    Objective:   Today's Vitals: BP 107/70   Pulse (!) 103   Temp 98.2 F (36.8 C) (Temporal)  Ht 5\' 7"  (1.702 m)   Wt 155 lb 6.4 oz (70.5 kg)   LMP 04/20/2021 (Exact Date)   SpO2 97%   BMI 24.34 kg/m   Physical Exam Vitals and nursing note reviewed.  Constitutional:      Appearance: Normal appearance.  Cardiovascular:     Rate and Rhythm: Normal rate and regular rhythm.  Pulmonary:     Effort: Pulmonary effort is normal.     Breath sounds: Normal breath sounds.  Musculoskeletal:        General: Normal range of motion.  Skin:    General: Skin is warm and dry.  Neurological:     Mental Status: She is alert.  Psychiatric:        Mood and Affect: Mood normal.        Behavior: Behavior normal.    Assessment & Plan:   Problem List Items Addressed This Visit        Other   Moderate major depression, single episode (HCC) - Primary    Has 11yo, single mom, started in 2018-19, started yoga, exercise. 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 Having more HA, not sleeping well, taking OTC sleep meds, doesn't maintain sleep. Starting Effexor, discussed use and side effects, pt will f/u in 1 month. Handout on handling depression with resources provided.      Relevant Medications   venlafaxine XR (EFFEXOR XR) 37.5 MG 24 hr capsule   Other Relevant Orders   Ambulatory referral to Psychology   Persistent headaches    Started a few months ago, have increased in frequency, denies prodromal sx, not migraine-like. Possibly r/t lack of sleep and current depression. Starting medication for depression which has headache preventative use also. Pt will f/u in 1 month.      Relevant Medications   venlafaxine XR (EFFEXOR XR) 37.5 MG 24 hr capsule    Outpatient Encounter Medications as of 05/15/2021  Medication Sig   venlafaxine XR (EFFEXOR XR) 37.5 MG 24 hr capsule Take 1 capsule (37.5 mg total) by mouth daily with breakfast.   [DISCONTINUED] cyclobenzaprine (FLEXERIL) 10 MG tablet Take 1 tablet (10 mg total) by mouth 3 (three) times daily as needed for muscle spasms. (Patient not taking: Reported on 05/15/2021)   [DISCONTINUED] ibuprofen (ADVIL,MOTRIN) 200 MG tablet Take 400 mg by mouth every 6 (six) hours as needed. (Patient not taking: Reported on 05/15/2021)   [DISCONTINUED] meloxicam (MOBIC) 7.5 MG tablet Take 1 tablet (7.5 mg total) by mouth daily. (Patient not taking: Reported on 05/15/2021)   No facility-administered encounter medications on file as of 05/15/2021.    Follow-up: Return in about 4 weeks (around 06/12/2021) for depression f/u.   14/02/2021, NP

## 2021-05-15 NOTE — Assessment & Plan Note (Addendum)
Has 30yo, single mom, started in 2018-19, started yoga, exercise. 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 Having more HA, not sleeping well, taking OTC sleep meds, doesn't maintain sleep. Starting Effexor, discussed use and side effects, pt will f/u in 1 month. Handout on handling depression with resources provided.

## 2021-06-02 ENCOUNTER — Telehealth: Payer: Self-pay

## 2021-06-02 NOTE — Telephone Encounter (Signed)
Patient called in and stated that she stopped taking venlafaxine XR (EFFEXOR XR) 37.5 MG 24 hr capsule since she started having muscle spasms and headaches.Patient is scheduled for appointment on 06/05/21.

## 2021-06-02 NOTE — Telephone Encounter (Signed)
FYI

## 2021-06-05 ENCOUNTER — Ambulatory Visit (INDEPENDENT_AMBULATORY_CARE_PROVIDER_SITE_OTHER): Payer: 59 | Admitting: Family

## 2021-06-05 ENCOUNTER — Encounter: Payer: Self-pay | Admitting: Family

## 2021-06-05 ENCOUNTER — Other Ambulatory Visit: Payer: Self-pay

## 2021-06-05 VITALS — BP 121/77 | HR 89 | Temp 98.1°F | Ht 67.0 in | Wt 157.2 lb

## 2021-06-05 DIAGNOSIS — R519 Headache, unspecified: Secondary | ICD-10-CM | POA: Diagnosis not present

## 2021-06-05 DIAGNOSIS — F321 Major depressive disorder, single episode, moderate: Secondary | ICD-10-CM

## 2021-06-05 MED ORDER — ESCITALOPRAM OXALATE 5 MG PO TABS: 5.0000 mg | ORAL_TABLET | Freq: Every day | ORAL | 0 refills | Status: DC

## 2021-06-05 NOTE — Progress Notes (Signed)
Subjective:     Patient ID: Brooke Mejia, female    DOB: 11-02-1990, 30 y.o.   MRN: [id number]  Chief Complaint  Patient presents with   Depression    Effexor has caused muscle spasms in her back. And has also intensified migraines.    Ear Fullness    Fluctuating between both ears. Noticed 2-3 weeks ago. She denies drainage.     HPI: Headache: Patient complains of headaches occurring almost daily. She does not have a headache at this time. She reports a dull aching that gradually starts at any time of the day and slowly worsens. She takes tylenol or Ibuprofen which eases HA but then it returns. She reports having poor sleep, feeling more depressed, and is a single mom, working full time and taking college classes. She denies nausea or prodromal sx, HA does not occur peri-menstrually.  Anxiety/Depression: Patient complains of depression.  She started Effexor after last visit, states it made her headache worse and caused muscle spasm in her back. She has the following symptoms: difficulty concentrating, fatigue, feelings of losing control, insomnia, irritable.  Onset of symptoms was approximately 3 years ago, She denies current suicidal and homicidal ideation.  Possible organic causes contributing are: none.  Risk factors: none  Previous treatment includes counseling and individual therapy. Started on Effexor last visit, but reports having worsening headaches and muscle spasms.   Ear fullness - pt describes a 2 week history of Bilateral ear congestion with no pain, fullness, pressure. Denies associated discharge, itching, fever    Health Maintenance Due  Topic Date Due   Hepatitis C Screening  Never done   PAP-Cervical Cytology Screening  12/31/2020   PAP SMEAR-Modifier  12/31/2020    Past Medical History:  Diagnosis Date   Trichimoniasis     Past Surgical History:  Procedure Laterality Date   HAND SURGERY      Outpatient Medications Prior to Visit  Medication Sig Dispense  Refill   venlafaxine XR (EFFEXOR XR) 37.5 MG 24 hr capsule Take 1 capsule (37.5 mg total) by mouth daily with breakfast. 30 capsule 0   No facility-administered medications prior to visit.    No Known Allergies      Objective:    Physical Exam Vitals and nursing note reviewed.  Constitutional:      Appearance: Normal appearance.  Cardiovascular:     Rate and Rhythm: Normal rate and regular rhythm.  Pulmonary:     Effort: Pulmonary effort is normal.     Breath sounds: Normal breath sounds.  Musculoskeletal:        General: Normal range of motion.  Skin:    General: Skin is warm and dry.  Neurological:     Mental Status: She is alert.  Psychiatric:        Mood and Affect: Mood normal.        Behavior: Behavior normal.    BP 121/77   Pulse 89   Temp 98.1 F (36.7 C) (Temporal)   Ht 5\' 7"  (1.702 m)   Wt 157 lb 3.2 oz (71.3 kg)   SpO2 100%   BMI 24.62 kg/m  Wt Readings from Last 3 Encounters:  06/05/21 157 lb 3.2 oz (71.3 kg)  05/15/21 155 lb 6.4 oz (70.5 kg)  01/24/19 153 lb (69.4 kg)       Assessment & Plan:   Problem List Items Addressed This Visit       Other   Moderate major depression, single episode (HCC)  Started taking Effexor in am for a few days, then switched to night time, but no change in side effects - c/o of HA worsening and muscle spasms. Switching to Lexapro, pt advised on use & SE.      Relevant Medications   escitalopram (LEXAPRO) 5 MG tablet   Persistent headaches - Primary    Daily, taking excedrin migraine and Ibuprofen pm in the evenings. Denies any triggers except for stress, depressive sx. Switching antidepressant med and will continue to follow.      Relevant Medications   escitalopram (LEXAPRO) 5 MG tablet    Meds ordered this encounter  Medications   escitalopram (LEXAPRO) 5 MG tablet    Sig: Take 1 tablet (5 mg total) by mouth daily.    Dispense:  30 tablet    Refill:  0    Order Specific Question:   Supervising  Provider    Answer:   ANDY, CAMILLE L [[id number]]

## 2021-06-05 NOTE — Assessment & Plan Note (Addendum)
Started taking Effexor in am for a few days, then switched to night time, but no change in side effects - c/o of HA worsening and muscle spasms. Switching to Lexapro, pt advised on use & SE.

## 2021-06-05 NOTE — Assessment & Plan Note (Addendum)
Daily, taking excedrin migraine and Ibuprofen pm in the evenings. Denies any triggers except for stress, depressive sx. Switching antidepressant med and will continue to follow.

## 2021-06-05 NOTE — Patient Instructions (Signed)
It was very nice to see you today!  I have sent the Lexapro to your pharmacy. You can start this tomorrow. Let us know if any concerns. Schedule a one month follow up visit.   PLEASE NOTE:  If you had any lab tests please let us know if you have not heard back within a few days. You may see your results on MyChart before we have a chance to review them but we will give you a call once they are reviewed by [place]. If we ordered any referrals today, please let us know if you have not heard from their office within the next week.   Please try these tips to maintain a healthy lifestyle:  Eat most of your calories during the day when you are active. Eliminate processed foods including packaged sweets (pies, cakes, cookies), reduce intake of potatoes, white bread, white pasta, and white rice. Look for whole grain options, oat flour or almond flour.  Each meal should contain half fruits/vegetables, one quarter protein, and one quarter carbs (no bigger than a computer mouse).  Cut down on sweet beverages. This includes juice, soda, and sweet tea. Also watch fruit intake, though this is a healthier sweet option, it still contains natural sugar! Limit to 3 servings daily.  Drink at least 1 glass of water with each meal and aim for at least 8 glasses per day  Exercise at least 150 minutes every week.

## 2021-06-17 ENCOUNTER — Ambulatory Visit (INDEPENDENT_AMBULATORY_CARE_PROVIDER_SITE_OTHER): Payer: 59 | Admitting: Psychologist

## 2021-06-17 ENCOUNTER — Ambulatory Visit (HOSPITAL_COMMUNITY)
Admission: RE | Admit: 2021-06-17 | Discharge: 2021-06-17 | Disposition: A | Discharge status: Home / Self Care | Payer: 59 | Attending: Psychiatry | Admitting: Psychiatry

## 2021-06-17 DIAGNOSIS — F322 Major depressive disorder, single episode, severe without psychotic features: Secondary | ICD-10-CM | POA: Diagnosis not present

## 2021-06-17 NOTE — Plan of Care (Signed)
Goals Alleviate depressive symptoms Recognize, accept, and cope with depressive feelings Develop healthy thinking patterns Develop healthy interpersonal relationships  Objectives Cooperate with a medication evaluation by a physician Verbalize an accurate understanding of depression Verbalize an understanding of the treatment Identify and replace thoughts that support depression Learn and implement behavioral strategies Verbalize an understanding and resolution of current interpersonal problems Learn and implement problem solving and decision making skills Learn and implement conflict resolution skills to resolve interpersonal problems Verbalize an understanding of healthy and unhealthy emotions verbalize insight into how past relationships may be influence current experiences with depression Use mindfulness and acceptance strategies and increase value based behavior  Increase hopeful statements about the future.  Interventions Consistent with treatment model, discuss how change in cognitive, behavioral, and interpersonal can help client alleviate depression CBT Behavioral activation help the client explore the relationship, nature of the dispute,  Help the client develop new interpersonal skills and relationships Conduct Problem so living therapy Teach conflict resolution skills Use a process-experiential approach Conduct TLDP Conduct ACT Evaluate need for psychotropic medication Monitor adherence to medication    Problem: Depression CCP Problem  1 alleviate depressive symptoms Goal:  " Alleviate depressive symptoms Outcome: Not Progressing Goal: " Cooperate with a medication evaluation by a physician Outcome: Not Progressing Goal: " Increase hopeful statements about the future Outcome: Not Progressing Goal: " Verbalize an understanding of healthy and unhealthy emotions verbalize insight into how past relationships may be influence current experiences with depression Outcome:  Not Progressing Goal: " Identify and replace thoughts that support depression Outcome: Not Progressing Goal: LTG: Reduce frequency, intensity, and duration of depression symptoms as evidenced by: SSB input needed on appropriate metric Outcome: Not Progressing Goal: LTG: Increase daily functioning by 80% as evidenced by SSB input needed on appropriate metric Outcome: Not Progressing Goal: STG: Dainelle WILL PARTICIPATE IN AT LEAST 80% OF SCHEDULED INDIVIDUAL PSYCHOTHERAPY SESSIONS Outcome: Not Progressing Goal: STG: Tyashia WILL COMPLETE AT LEAST 80% OF ASSIGNED HOMEWORK Outcome: Not Progressing
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
## 2021-06-17 NOTE — Progress Notes (Signed)
[facility] Counselor Initial Adult Exam  Name: Brooke Mejia Date: 06/17/2021 MRN: [id number] DOB: March 25, 1991 PCP: Dulce Sellar, NP  Time spent: 3:05 pm to 4:00 pm; Total time: 55 minutes  This session was held via video webex teletherapy due to the coronavirus risk at this time. The patient consented to video teletherapy and was located at her home during this session. She is aware it is the responsibility of the patient to secure confidentiality on her end of the session. The provider was in a private home office for the duration of this session. Limits of confidentiality were discussed with the patient.   Guardian/Payee:  NA    Paperwork requested: No   Reason for Visit /Presenting Problem: Patient is experiencing depressive symptoms including active suicidal ideation. Patient endorsed active suicidal ideation with a plan. She stated that she does not want to suffer. That she would go "get a gun and end it". Per the patient, she is an 8 out of 10 to act on that thought. She was agreeable to going to the [facility] to get evaluated. Patient stated that she would go today and get evaluated at the hospital due to active suicidal ideation. She denied having direct access to a firearm.   Mental Status Exam: Appearance:   Well Groomed     Behavior:  Resistant  Motor:  Normal  Speech/Language:   Normal Rate  Affect:  Tearful  Mood:  sad  Thought process:  normal  Thought content:    WNL  Sensory/Perceptual disturbances:    WNL  Orientation:  oriented to person, place, time/date, situation, and day of week  Attention:  Good  Concentration:  Good  Memory:  WNL  Fund of knowledge:   Good  Insight:    Fair  Judgment:   Fair  Impulse Control:  Good     Reported Symptoms:  Patient endorsed experiencing the following: rumination of thoughts, feeling down, social isolation, avoiding pleasurable activities, low self-esteem, thoughts of  hopelessness and worthlessness, lack of motivation. She endorsed active suicidal ideation. She stated that she would go to a store to get a gun to "end it". Patient denied direct access to a gun. The patient stated that she was a 8 out of 10 to act on the thoughts of getting a gun. Patient was agreeable to going to the [facility] to get evaluated due to active suicidal ideation.   Risk Assessment: Danger to Self:  Yes.  with intent/plan. Patient endorsed experiencing the following: rumination of thoughts, feeling down, social isolation, avoiding pleasurable activities, low self-esteem, thoughts of hopelessness and worthlessness, lack of motivation. She endorsed active suicidal ideation. She stated that she would go to a store to get a gun to "end it". Patient denied direct access to a gun. The patient stated that she was a 8 out of 10 to act on the thoughts of getting a gun. Patient was agreeable to going to the [facility] to get evaluated due to active suicidal ideation.  Self-injurious Behavior: Yes.  with intent/plan Danger to Others: No Duty to Warn:no Physical Aggression / Violence:No  Access to Firearms a concern:  Patient stated that she would have to drive to a store to get a gun.  Gang Involvement:No  Patient / guardian was educated about steps to take if suicide or homicide risk level increases between visits: yes While future psychiatric events cannot be accurately predicted, the patient does not currently require acute  inpatient psychiatric care and does not currently meet [facility] involuntary commitment criteria.  Substance Abuse History: Current substance abuse: Yes   Patient stated that she drinks wine regularly. Per the patient, she indicated that she can go through an entire bottle at one time. Per the patient, she is working on cutting back. The patient also stated that she is vaping.   Past Psychiatric History:   Previous  psychological history is significant for depression Outpatient Providers:Patient did not recall the name of the providers. She stated that she had a bad experience and did not feel heard by either clinician.  History of Psych Hospitalization: No  Psychological Testing:  NA    Abuse History:  Victim of: No.,  NA    Report needed: No. Victim of Neglect:No. Perpetrator of  NA   Witness / Exposure to Domestic Violence:  NA   Protective Services Involvement: No  Witness to [employer] Violence:  No   Family History: No family history on file.  Living situation: the patient lives with her son  Sexual Orientation: Straight  Relationship Status: single  Name of spouse / other:NA If a parent, number of children / ages: Patient has a 34 year old son.   Support Systems: Patient has a limited support system.   Financial Stress:  Yes   Income/Employment/Disability: Employment  Financial planner: No   Educational History: Education:  Patient currently has her GED. Patient is working towards an associates degree in nursing. Per the patient, she ultimately wants to end up in the OR.   Religion/Sprituality/World View: Patient indicated that her faith is important to her.   Any cultural differences that may affect / interfere with treatment:  NA  Recreation/Hobbies: Watching Netflix  Stressors: Educational concerns   Health problems   Marital or family conflict    Strengths: Patient has limited supports.   Barriers:  Patient has a limited support system. Patient has limited help.    Legal History: Pending legal issue / charges: The patient has no significant history of legal issues. History of legal issue / charges:  NA  Medical History/Surgical History: reviewed Past Medical History:  Diagnosis Date   Trichimoniasis     Past Surgical History:  Procedure Laterality Date   HAND SURGERY      Medications: Current Outpatient Medications  Medication Sig Dispense Refill    escitalopram (LEXAPRO) 5 MG tablet Take 1 tablet (5 mg total) by mouth daily. 30 tablet 0   No current facility-administered medications for this visit.    No Known Allergies  Diagnoses:  F32.2 major depressive affective disorder, single episode, severe  Plan of Care: The patient is a 30 year old black woman who was referred due to experiencing depressive symptoms. The patient lives at home with her son. The patient meets criteria for a diagnosis of F32.2 major depressive affective disorder, single episode, severe based off of the following: rumination of thoughts, feeling down, social isolation, avoiding pleasurable activities, low self-esteem, thoughts of hopelessness and worthlessness, lack of motivation. She endorsed active suicidal ideation. She stated that she would go to a store to get a gun to "end it". Patient denied direct access to a gun. The patient stated that she was a 8 out of 10 to act on the thoughts of getting a gun. Patient was agreeable to going to the [facility] to get evaluated due to active suicidal ideation.   The patient consented and agreed to go to [facility] to  get evaluated due to active suicidal ideation with a plan.   This psychologist makes the recommendation that the patient get evaluated at [facility]. Once the patient has stabilized, the patient would benefit from weekly therapy to meet her psychological needs at this time.   Hilbert Corrigan, PsyD

## 2021-06-17 NOTE — BH Assessment (Addendum)
[facility] notified this Clinician that patient is presenting to [facility] as a walk-in MSE completed. The [facility] provider Elta Guadeloupe, NP) evaluated patient, complete MSE, and patient was discharged. Per [facility] provider patient was encouraged to follow up with current provider Hilbert Corrigan, Psy D) as a follow recommendation. No TTS involved or CCA for this patient.

## 2021-06-24 ENCOUNTER — Ambulatory Visit: Payer: 59 | Admitting: Psychologist

## 2021-07-10 ENCOUNTER — Telehealth: Payer: Self-pay | Admitting: Family

## 2021-07-10 ENCOUNTER — Ambulatory Visit: Payer: 59 | Admitting: Psychologist

## 2021-07-10 NOTE — Telephone Encounter (Signed)
Pt was sent to [facility] due to circumstance. After speaking with therapist, they suggested mentioning upping dose of medication below.   escitalopram (LEXAPRO) 5 MG tablet

## 2021-07-11 ENCOUNTER — Other Ambulatory Visit: Payer: Self-pay | Admitting: Family

## 2021-07-11 DIAGNOSIS — F321 Major depressive disorder, single episode, moderate: Secondary | ICD-10-CM

## 2021-07-11 MED ORDER — ESCITALOPRAM OXALATE 10 MG PO TABS: 10.0000 mg | ORAL_TABLET | Freq: Every day | ORAL | 0 refills | Status: DC

## 2021-07-11 NOTE — Telephone Encounter (Signed)
yes, that's fine, will send 10mg  dose, but she can take 2 5mg  pills if she still has any, thx

## 2021-07-11 NOTE — Telephone Encounter (Signed)
Called pt to give message below. She expressed understanding and agrees with plan.

## 2021-07-16 ENCOUNTER — Ambulatory Visit: Payer: 59 | Admitting: Family

## 2021-07-16 NOTE — Progress Notes (Deleted)
° °  Subjective:     Patient ID: Brooke Mejia, female    DOB: 1990/07/13, 31 y.o.   MRN: [id number]  No chief complaint on file.   HPI: Anxiety/Depression: Patient complains of depression.  She started Effexor after last visit, states it made her headache worse and caused muscle spasm in her back. She has the following symptoms: difficulty concentrating, fatigue, feelings of losing control, insomnia, irritable.  Onset of symptoms was approximately 3 years ago, She denies current suicidal and homicidal ideation.  Possible organic causes contributing are: none.  Risk factors: none  Previous treatment includes counseling and individual therapy. Started on Effexor last visit, but reports having worsening headaches and muscle spasms.    Health Maintenance Due  Topic Date Due   Hepatitis C Screening  Never done   PAP-Cervical Cytology Screening  12/31/2020   PAP SMEAR-Modifier  12/31/2020    Past Medical History:  Diagnosis Date   Trichimoniasis     Past Surgical History:  Procedure Laterality Date   HAND SURGERY      Outpatient Medications Prior to Visit  Medication Sig Dispense Refill   escitalopram (LEXAPRO) 10 MG tablet Take 1 tablet (10 mg total) by mouth daily. 90 tablet 0   No facility-administered medications prior to visit.    No Known Allergies      Objective:    Physical Exam  There were no vitals taken for this visit. Wt Readings from Last 3 Encounters:  06/05/21 157 lb 3.2 oz (71.3 kg)  05/15/21 155 lb 6.4 oz (70.5 kg)  01/24/19 153 lb (69.4 kg)       Assessment & Plan:   Problem List Items Addressed This Visit   None   No orders of the defined types were placed in this encounter.

## 2021-07-16 NOTE — Telephone Encounter (Signed)
FYI

## 2021-07-16 NOTE — Telephone Encounter (Signed)
Patient had to move appt to Friday.  She wanted to let Hudnell know that she has picked up the medication and so far feels like she is doing well.

## 2021-07-17 ENCOUNTER — Ambulatory Visit: Payer: 59 | Admitting: Family

## 2021-07-18 ENCOUNTER — Ambulatory Visit (INDEPENDENT_AMBULATORY_CARE_PROVIDER_SITE_OTHER): Payer: 59 | Admitting: Family

## 2021-07-18 ENCOUNTER — Encounter: Payer: Self-pay | Admitting: Family

## 2021-07-18 ENCOUNTER — Other Ambulatory Visit: Payer: Self-pay

## 2021-07-18 VITALS — BP 120/86 | HR 95 | Temp 98.4°F | Ht 67.0 in | Wt 151.4 lb

## 2021-07-18 DIAGNOSIS — F5102 Adjustment insomnia: Secondary | ICD-10-CM

## 2021-07-18 DIAGNOSIS — F321 Major depressive disorder, single episode, moderate: Secondary | ICD-10-CM

## 2021-07-18 MED ORDER — TRAZODONE HCL 50 MG PO TABS: 25.0000 mg | ORAL_TABLET | Freq: Every day | ORAL | 5 refills | Status: DC

## 2021-07-18 NOTE — Assessment & Plan Note (Signed)
pt has been taking OTC advil pm to help with sleep for long time, and feels it is no longer helping. she has been having a lot of stress recently. Starting Trazodone 50mg , advised to take 1/2 pill to start, then increase to 1 full pill if needed. Advised med works best if not taken every night.

## 2021-07-18 NOTE — Assessment & Plan Note (Addendum)
pt doing better on increased Lexapro. Had a breakdown in Dec with family, saw [facility] provider and he sent her to hospital behavioral health, pt was not admitted. Called the office here and I increased her Lexapro. Pt is requesting a new therapy referral.

## 2021-07-18 NOTE — Progress Notes (Signed)
Subjective:     Patient ID: Brooke Mejia, female    DOB: 06-26-1991, 31 y.o.   MRN: [id number]  Chief Complaint  Patient presents with   Depression    Hos Follow up; She increased her dose of Lexapro and says that she is feeling better today.    HPI: Anxiety/Depression: Patient complains of depression.  She has been doing better on increased Lexapro. She has the following symptoms: difficulty concentrating, fatigue, insomnia, irritable.  Onset of symptoms was approximately 3 years ago, She denies current suicidal and homicidal ideation.  Possible organic causes contributing are: none.  Risk factors: none  She started individual therapy, but is not happy with current provider, would like a new referral. INSOMNIA:  How long: months. Difficulty initiating sleep: yes.  Difficulty maintaining sleep: yes.  OTC meds tried: Advil PM. RX meds in past: none. Sleep hygiene measures: yes. Started any new meds recently: Lexapro. Shift worker: no. New stressors: yes.   Health Maintenance Due  Topic Date Due   Hepatitis C Screening  Never done   PAP-Cervical Cytology Screening  12/31/2020   PAP SMEAR-Modifier  12/31/2020    Past Medical History:  Diagnosis Date   Trichimoniasis     Past Surgical History:  Procedure Laterality Date   HAND SURGERY      Outpatient Medications Prior to Visit  Medication Sig Dispense Refill   escitalopram (LEXAPRO) 10 MG tablet Take 1 tablet (10 mg total) by mouth daily. 90 tablet 0   No facility-administered medications prior to visit.    No Known Allergies      Objective:    Physical Exam Vitals and nursing note reviewed.  Constitutional:      Appearance: Normal appearance.  Cardiovascular:     Rate and Rhythm: Normal rate and regular rhythm.  Pulmonary:     Effort: Pulmonary effort is normal.     Breath sounds: Normal breath sounds.  Musculoskeletal:        General: Normal range of motion.  Skin:    General: Skin is warm and  dry.  Neurological:     Mental Status: She is alert.  Psychiatric:        Mood and Affect: Mood normal.        Behavior: Behavior normal.    BP 120/86    Pulse 95    Temp 98.4 F (36.9 C) (Temporal)    Ht 5\' 7"  (1.702 m)    Wt 151 lb 6.4 oz (68.7 kg)    SpO2 100%    BMI 23.71 kg/m  Wt Readings from Last 3 Encounters:  07/18/21 151 lb 6.4 oz (68.7 kg)  06/05/21 157 lb 3.2 oz (71.3 kg)  05/15/21 155 lb 6.4 oz (70.5 kg)       Assessment & Plan:   Problem List Items Addressed This Visit       Other   Moderate major depression, single episode (HCC) - Primary    pt doing better on increased Lexapro. Had a breakdown in Dec with family, saw [facility] provider and he sent her to hospital behavioral health, pt was not admitted. Called the office here and I increased her Lexapro. Pt is requesting a new therapy referral.      Relevant Medications   traZODone (DESYREL) 50 MG tablet   Adjustment insomnia    pt has been taking OTC advil pm to help with sleep for long time, and feels it is no longer helping. she has been having a lot  of stress recently. Starting Trazodone 50mg , advised to take 1/2 pill to start, then increase to 1 full pill if needed. Advised med works best if not taken every night.      Relevant Medications   traZODone (DESYREL) 50 MG tablet    Meds ordered this encounter  Medications   traZODone (DESYREL) 50 MG tablet    Sig: Take 0.5-1 tablets (25-50 mg total) by mouth at bedtime.    Dispense:  30 tablet    Refill:  5    Order Specific Question:   Supervising Provider    Answer:   ANDY, CAMILLE L [[id number]]

## 2021-08-21 ENCOUNTER — Other Ambulatory Visit: Payer: Self-pay | Admitting: Family

## 2021-08-21 DIAGNOSIS — F321 Major depressive disorder, single episode, moderate: Secondary | ICD-10-CM

## 2021-08-21 MED ORDER — ESCITALOPRAM OXALATE 10 MG PO TABS: 10.0000 mg | ORAL_TABLET | Freq: Every day | ORAL | 0 refills | Status: DC

## 2021-08-25 ENCOUNTER — Other Ambulatory Visit: Payer: Self-pay

## 2021-08-25 ENCOUNTER — Other Ambulatory Visit (HOSPITAL_COMMUNITY): Payer: Self-pay

## 2021-08-25 ENCOUNTER — Telehealth: Payer: Self-pay | Admitting: Family

## 2021-08-25 DIAGNOSIS — F321 Major depressive disorder, single episode, moderate: Secondary | ICD-10-CM

## 2021-08-25 MED ORDER — ESCITALOPRAM OXALATE 10 MG PO TABS
10.0000 mg | ORAL_TABLET | Freq: Every day | ORAL | 0 refills | Status: DC
  Filled 2021-08-25: qty 90, 90d supply, fill #0

## 2021-08-25 NOTE — Telephone Encounter (Signed)
Refill sent.

## 2021-08-25 NOTE — Telephone Encounter (Signed)
Patient called stating she has been having issues with her med refills for escitalopram (LEXAPRO) 10 MG tablet   Patient is asking if we can please [facility] asap as she has no more medications.

## 2021-08-28 ENCOUNTER — Other Ambulatory Visit: Payer: Self-pay

## 2021-08-28 ENCOUNTER — Telehealth: Payer: 59 | Admitting: Family

## 2021-09-04 NOTE — Progress Notes (Signed)
pt was a no show for virtual visit.

## 2021-09-18 DIAGNOSIS — N898 Other specified noninflammatory disorders of vagina: Secondary | ICD-10-CM | POA: Diagnosis not present

## 2021-09-18 DIAGNOSIS — N760 Acute vaginitis: Secondary | ICD-10-CM | POA: Diagnosis not present

## 2021-09-18 DIAGNOSIS — Z113 Encounter for screening for infections with a predominantly sexual mode of transmission: Secondary | ICD-10-CM | POA: Diagnosis not present

## 2021-11-21 ENCOUNTER — Other Ambulatory Visit: Payer: Self-pay | Admitting: Family

## 2021-11-21 ENCOUNTER — Other Ambulatory Visit (HOSPITAL_COMMUNITY): Payer: Self-pay

## 2021-11-21 DIAGNOSIS — F321 Major depressive disorder, single episode, moderate: Secondary | ICD-10-CM

## 2021-11-24 ENCOUNTER — Other Ambulatory Visit (HOSPITAL_COMMUNITY): Payer: Self-pay

## 2021-11-25 ENCOUNTER — Telehealth: Payer: 59 | Admitting: Family

## 2021-11-26 ENCOUNTER — Ambulatory Visit (INDEPENDENT_AMBULATORY_CARE_PROVIDER_SITE_OTHER): Payer: 59 | Admitting: Family

## 2021-11-26 ENCOUNTER — Encounter: Payer: Self-pay | Admitting: Family

## 2021-11-26 VITALS — BP 102/82 | HR 75 | Temp 98.4°F | Ht 67.0 in | Wt 159.8 lb

## 2021-11-26 DIAGNOSIS — F321 Major depressive disorder, single episode, moderate: Secondary | ICD-10-CM

## 2021-11-26 DIAGNOSIS — F5102 Adjustment insomnia: Secondary | ICD-10-CM

## 2021-11-26 MED ORDER — ESCITALOPRAM OXALATE 20 MG PO TABS
20.0000 mg | ORAL_TABLET | Freq: Every day | ORAL | 2 refills | Status: DC
  Filled 2021-12-23: qty 30, 30d supply, fill #0
  Filled 2022-01-20: qty 30, 30d supply, fill #1
  Filled 2022-02-23: qty 30, 30d supply, fill #2

## 2021-11-26 MED ORDER — DAYVIGO 5 MG PO TABS: 5.0000 mg | ORAL_TABLET | Freq: Every evening | ORAL | 2 refills | Status: DC | PRN

## 2021-11-26 MED ORDER — DAYVIGO 5 MG PO TABS
5.0000 mg | ORAL_TABLET | Freq: Every evening | ORAL | 2 refills | Status: DC | PRN
Start: 2021-11-26 — End: 2022-05-04
  Filled 2022-02-23: qty 30, 30d supply, fill #0

## 2021-11-26 MED ORDER — ESCITALOPRAM OXALATE 20 MG PO TABS: 10.0000 mg | ORAL_TABLET | Freq: Every day | ORAL | 2 refills | Status: DC

## 2021-11-26 NOTE — Assessment & Plan Note (Addendum)
Chronic - reports Lexapro not helping with increased dose, will increase again, discussed if this not helping, may need to add Wellbutrin or Abilify.  Also did not get appt with psych, they are too full to see her, sending new referral.

## 2021-11-26 NOTE — Assessment & Plan Note (Addendum)
Chronic -  Started Trazodone last visit, 1/2 pill not enough and 1 full pill causes too much drowsiness in am. Starting Dayvigo, advised on use & SE.

## 2021-11-26 NOTE — Progress Notes (Signed)
Subjective:     Patient ID: Brooke Mejia, female    DOB: Nov 26, 1990, 31 y.o.   MRN: [id number]  Chief Complaint  Patient presents with   Medication Refill    Pt here to receive a refill on Lexapro. Pt stated that she still feels that she is not doing any better since the dosage was up.   HPI: Anxiety/Depression: Patient complains of depression.  She has been doing better on increased Lexapro. She has the following symptoms: difficulty concentrating, fatigue, insomnia, irritable. Onset of symptoms was approximately 3 years ago, She denies current suicidal and homicidal ideation. Risk factors: previous depression. pt states little to no difference with increase in Lexapro, will increase again.She has had therapy in past, referred last visit, but they are unable to see her d/t being full, needs new referral.  INSOMNIA:  How long: months. Difficulty initiating sleep: yes.  Difficulty maintaining sleep: yes.  OTC meds tried: Advil PM. RX meds in past: none. Sleep hygiene measures: yes. Started any new meds recently: Lexapro. Shift worker: no. 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 Started Trazodone last visit, 1/2 pill not enough and 1 full pill causes too much drowsiness in am.  Assessment & Plan:   Problem List Items Addressed This Visit       Other   Moderate major depression, single episode (HCC)    Chronic - reports Lexapro not helping with increased dose, will increase again, discussed if this not helping, may need to add Wellbutrin or Abilify.  Also did not get appt with psych, they are too full to see her, sending new referral.       Relevant Medications   escitalopram (LEXAPRO) 20 MG tablet   Adjustment insomnia - Primary    Chronic -  Started Trazodone last visit, 1/2 pill not enough and 1 full pill causes too much drowsiness in am. Starting TDVVOHY, advised on use & SE.       Relevant Medications   Lemborexant (DAYVIGO) 5 MG TABS     Outpatient Medications Prior to Visit  Medication Sig Dispense Refill   traZODone (DESYREL) 50 MG tablet Take 0.5-1 tablets (25-50 mg total) by mouth at bedtime. 30 tablet 5   escitalopram (LEXAPRO) 10 MG tablet Take 1 tablet (10 mg total) by mouth daily. 90 tablet 0   No facility-administered medications prior to visit.    Past Medical History:  Diagnosis Date   Trichimoniasis     Past Surgical History:  Procedure Laterality Date   HAND SURGERY      No Known Allergies     Objective:    Physical Exam Vitals and nursing note reviewed.  Constitutional:      Appearance: Normal appearance.  Cardiovascular:     Rate and Rhythm: Normal rate and regular rhythm.  Pulmonary:     Effort: Pulmonary effort is normal.     Breath sounds: Normal breath sounds.  Musculoskeletal:        General: Normal range of motion.  Skin:    General: Skin is warm and dry.  Neurological:     Mental Status: She is alert.  Psychiatric:        Mood and Affect: Mood normal.        Behavior: Behavior normal.    BP 102/82   Pulse 75   Temp 98.4 F (36.9 C)   Ht 5\' 7"  (1.702 m)   Wt 159 lb 12.8 oz (72.5 kg)  SpO2 96%   BMI 25.03 kg/m  Wt Readings from Last 3 Encounters:  11/26/21 159 lb 12.8 oz (72.5 kg)  07/18/21 151 lb 6.4 oz (68.7 kg)  06/05/21 157 lb 3.2 oz (71.3 kg)       Meds ordered this encounter  Medications   escitalopram (LEXAPRO) 20 MG tablet    Sig: Take 0.5 tablets (10 mg total) by mouth daily.    Dispense:  30 tablet    Refill:  2    Order Specific Question:   Supervising Provider    Answer:   ANDY, CAMILLE L [[id number]]   Lemborexant (DAYVIGO) 5 MG TABS    Sig: Take 5 mg by mouth at bedtime as needed.    Dispense:  30 tablet    Refill:  2    Order Specific Question:   Supervising Provider    Answer:   ANDY, CAMILLE L [[id number]]    Dulce Sellar, NP

## 2021-12-23 ENCOUNTER — Other Ambulatory Visit (HOSPITAL_COMMUNITY): Payer: Self-pay

## 2022-01-07 ENCOUNTER — Ambulatory Visit: Payer: Self-pay

## 2022-01-07 ENCOUNTER — Other Ambulatory Visit: Payer: Self-pay | Admitting: Nurse Practitioner

## 2022-01-07 ENCOUNTER — Other Ambulatory Visit (HOSPITAL_COMMUNITY): Payer: Self-pay

## 2022-01-07 DIAGNOSIS — M25512 Pain in left shoulder: Secondary | ICD-10-CM

## 2022-01-07 MED ORDER — METHOCARBAMOL 500 MG PO TABS
500.0000 mg | ORAL_TABLET | Freq: Every evening | ORAL | 0 refills | Status: DC | PRN
  Filled 2022-01-07: qty 15, 15d supply, fill #0

## 2022-01-13 ENCOUNTER — Encounter: Payer: Self-pay | Admitting: Physical Therapy

## 2022-01-13 ENCOUNTER — Ambulatory Visit: Payer: PRIVATE HEALTH INSURANCE | Attending: Nurse Practitioner | Admitting: Physical Therapy

## 2022-01-13 DIAGNOSIS — R252 Cramp and spasm: Secondary | ICD-10-CM | POA: Diagnosis present

## 2022-01-13 DIAGNOSIS — M6281 Muscle weakness (generalized): Secondary | ICD-10-CM | POA: Insufficient documentation

## 2022-01-13 DIAGNOSIS — M25512 Pain in left shoulder: Secondary | ICD-10-CM | POA: Diagnosis not present
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
## 2022-01-16 ENCOUNTER — Ambulatory Visit: Payer: PRIVATE HEALTH INSURANCE

## 2022-01-16 DIAGNOSIS — R252 Cramp and spasm: Secondary | ICD-10-CM

## 2022-01-16 DIAGNOSIS — M6281 Muscle weakness (generalized): Secondary | ICD-10-CM

## 2022-01-16 DIAGNOSIS — M25512 Pain in left shoulder: Secondary | ICD-10-CM

## 2022-01-16 NOTE — Therapy (Signed)
OUTPATIENT PHYSICAL THERAPY TREATMENT NOTE   Patient Name: Brooke Mejia MRN: [id number] DOB:September 26, 1990, 31 y.o., female Today's Date: 01/17/2022  PCP: Jeanie Sewer, NP REFERRING PROVIDER: Drucilla Chalet, NP   END OF SESSION:   PT End of Session - 01/16/22 0850     Visit Number 2    Number of Visits 6    Date for PT Re-Evaluation 02/24/22    Authorization - Visit Number 2    Authorization - Number of Visits 6    PT Start Time 0850    PT Stop Time 0945    PT Time Calculation (min) 55 min    Activity Tolerance Patient tolerated treatment well    Behavior During Therapy [facility] for tasks assessed/performed             Past Medical History:  Diagnosis Date   Trichimoniasis    Past Surgical History:  Procedure Laterality Date   HAND SURGERY     Patient Active Problem List   Diagnosis Date Noted   Adjustment insomnia 07/18/2021   Moderate major depression, single episode ([place]) 05/15/2021   Persistent headaches 05/15/2021   Abnormal cervical Papanicolaou smear 03/02/2018   Left ovarian cyst 06/24/2017    REFERRING DIAG: S40.012A (ICD-10-CM) - Contusion of left shoulder, initial encounter S16.1XXA (ICD-10-CM) - Strain of muscle, fascia and tendon at neck level, initial encounter   THERAPY DIAG:  Acute pain of left shoulder  Muscle weakness (generalized)  Cramp and spasm  Rationale for Evaluation and Treatment Rehabilitation    SUBJECTIVE:                                                                                                                                                                                      SUBJECTIVE STATEMENT: Pt reports her l shoulder has been bothering her more after taking her son to a [employer] and trying to use her L arm. In the water for approx 5 mins.   PERTINENT HISTORY: None relevant    PAIN:  Are you having pain? Yes: NPRS scale: 5/10 Pain location: L shoulder  Pain description: pulling, aching, sore Aggravating  factors: laying on it  Relieving factors: sling, meds, heating pad  Not medicated    PRECAUTIONS: None   WEIGHT BEARING RESTRICTIONS No   FALLS:  Has patient fallen in last 6 months? 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 Vacation upcoming  and wants to go to school as surgical tech next month.    OBJECTIVE: (objective measures completed at initial evaluation unless otherwise dated)   DIAGNOSTIC FINDINGS:  FINDINGS: There is no evidence of fracture or dislocation. There is no evidence of arthropathy or other focal bone abnormality. Soft tissues are unremarkable.   IMPRESSION: Negative.     PATIENT SURVEYS:  FOTO 42% DASH NT   COGNITION:           Overall cognitive status: Within functional limits for tasks assessed                                  SENSATION: WFL   POSTURE: WNL    UPPER EXTREMITY ROM:    Active ROM Right eval Left eval  Shoulder flexion   98  Shoulder extension      Shoulder abduction   90  Shoulder adduction      Shoulder internal rotation   FR to mid back  PROM WNL with pain   Shoulder external rotation   FR NT PROM WNL with pain   Elbow flexion      Elbow extension      Wrist flexion      Wrist extension      Wrist ulnar deviation      Wrist radial deviation      Wrist pronation      Wrist supination      (Blank rows = not tested)   UPPER EXTREMITY MMT:   MMT Right eval Left eval  Shoulder flexion   NT due to pain   Shoulder extension      Shoulder abduction   NT due to pain   Shoulder adduction      Shoulder internal rotation   4+  Shoulder external rotation   4+ pain   Middle trapezius      Lower trapezius      Elbow flexion      Elbow extension      Wrist flexion      Wrist  extension      Wrist ulnar deviation      Wrist radial deviation      Wrist pronation      Wrist supination      Grip strength (lbs)      (Blank rows = not tested)   SHOULDER SPECIAL TESTS:            Impingement tests:  NT            SLAP lesions:  NT            Instability tests:  NT            Rotator cuff assessment:  NT            Biceps assessment:  NT   JOINT MOBILITY TESTING:  guarded   PALPATION:  Pain L AC joint along clavicle and into shoulder blade , deltoid anterior and middle             TODAY'S TREATMENT:  [facility] Adult PT Treatment:                                                DATE: 01/16/22 Therapeutic Exercise: - Seated Shoulder Flexion Towel Slide at Table Top 10 reps -  10-15 hold - Seated Scapular Retraction  10 reps - 5 hold - Seated Gentle Upper Trapezius Stretch 4 reps - 20 holh  Manual Therapy: STM to the L upper shoulder and neck with increased muscle tension and TrPs noted Modalities: Moist heat to the L shoulder and neck x15 mins s/p TPDN  Trigger Point Dry Needling Treatment: Pre-treatment instruction: Patient instructed on dry needling rationale, procedures, and possible side effects including pain during treatment (achy,cramping feeling), bruising, drop of blood, lightheadedness, nausea, sweating. Patient Consent Given: Yes Education handout provided: Yes Muscles treated: L upper trap  Needle size and number: .30x73mm x 1 Electrical stimulation performed: No Parameters: N/A Treatment response/outcome: Twitch response elicited Post-treatment instructions: Patient instructed to expect possible mild to moderate muscle soreness later today and/or tomorrow. Patient instructed in methods to reduce muscle soreness and to continue prescribed HEP. If patient was dry needled over the lung field, patient was instructed on signs and symptoms of pneumothorax and, however unlikely, to see immediate medical attention should they occur. Patient was also educated  on signs and symptoms of infection and to seek medical attention should they occur. Patient verbalized understanding of these instructions and education.   Eval Treatment: PT eval, HEP    PATIENT EDUCATION: Education details:HEP, sling, nerve tension, ROM, strength inhibited by pain  Person educated: Patient Education method: Explanation, Tactile cues, Verbal cues, and Handouts Education comprehension: verbalized understanding and needs further education     HOME EXERCISE PROGRAM: Access Code: [email or web address] URL: [email or web address] Date: 01/13/2022 Prepared by: Karie Mainland   Exercises - Seated Shoulder Flexion Towel Slide at Table Top  - 2 x daily - 7 x weekly - 2 sets - 10 reps - 10-15 hold - Seated Shoulder Abduction Towel Slide at Table Top  - 2 x daily - 7 x weekly - 2 sets - 10 reps - 10-15 hold - Seated Scapular Retraction  - 2-3 x daily - 7 x weekly - 2 sets - 10 reps - Seated Gentle Upper Trapezius Stretch  - 2 x daily - 7 x weekly - 1 sets - 3-5 reps - 30 hold - Supine Shoulder Flexion AAROM  - 2 x daily - 7 x weekly - 2 sets - 10 reps - 5 hold   ASSESSMENT:   CLINICAL IMPRESSION: PT was provided for STM to the L neck and upper shoulder f/b TPDN to the L upper trap. Muscle twitches were achieved. Pt then completed stretches to the L upper trap and and cervical muscles. Pt reported an increase in L neck pain and moist heat was provided for pain management. Pt was advised of increased muscle soreness after TPDN of 8-24 hours. If TPDN is effective, pain relief and decrease muscle tightness is anticipated after those time frames. Pt voiced understanding. Pt is to use moist heat at home as needed to address pain and muscle tightness and complete HEP to assist c l shoulder nad neck mobility.  OBJECTIVE IMPAIRMENTS decreased activity tolerance, decreased mobility, decreased ROM, decreased strength, increased fascial restrictions, increased muscle spasms, impaired  flexibility, impaired UE functional use, and pain.    ACTIVITY LIMITATIONS carrying, lifting, bending, sitting, standing, sleeping, reach over head, hygiene/grooming, and caring for others   PARTICIPATION LIMITATIONS: meal prep, cleaning, laundry, interpersonal relationship, driving, shopping, community activity, occupation, yard work, and school   PERSONAL FACTORS Profession and 1 comorbidity: none   are also affecting patient's functional outcome.    REHAB POTENTIAL: Excellent   CLINICAL DECISION MAKING: Stable/uncomplicated  EVALUATION COMPLEXITY: Low     GOALS: Goals reviewed with patient? Yes     LONG TERM GOALS: Target date: 02/24/2022  (Remove Blue Hyperlink)   Patient will be I with HEP for  shoulder ROM and strength  Baseline: given on eval  Goal status: INITIAL   2.  Pt will be able to lift arm overhead with min increase in pain  Baseline: unable > 98 deg  Goal status: INITIAL   3.  Pt will be able to carry, lift items with L UE < 10 lbs for improved work tolerance  Baseline: avoids  Goal status: INITIAL   4.  Pt will be abel to demo 4/5 or more strength in L UE for optimizing shoulder function at work and home.  Baseline:  Goal status: INITIAL   5.  Pt will be without the sling and pain minimized at rest  Baseline:  Goal status: INITIAL       PLAN: PT FREQUENCY: 2x/week   PT DURATION: 6 weeks   PLANNED INTERVENTIONS: Therapeutic exercises, Therapeutic activity, Neuromuscular re-education, Balance training, Gait training, Patient/Family education, Joint mobilization, Dry Needling, Electrical stimulation, Cryotherapy, Moist heat, Taping, Ultrasound, Ionotophoresis 4mg /ml Dexamethasone, Manual therapy, and Re-evaluation   PLAN FOR NEXT SESSION: check HEP and advance to dowel, AAROM. Manual .  Consider DN to L scap, upper trap   [employer] MS, PT 01/17/22 12:14 PM

## 2022-01-20 ENCOUNTER — Ambulatory Visit: Payer: PRIVATE HEALTH INSURANCE

## 2022-01-20 ENCOUNTER — Other Ambulatory Visit (HOSPITAL_COMMUNITY): Payer: Self-pay

## 2022-01-20 DIAGNOSIS — M25512 Pain in left shoulder: Secondary | ICD-10-CM

## 2022-01-20 DIAGNOSIS — R252 Cramp and spasm: Secondary | ICD-10-CM

## 2022-01-20 DIAGNOSIS — M6281 Muscle weakness (generalized): Secondary | ICD-10-CM

## 2022-01-20 NOTE — Therapy (Signed)
OUTPATIENT PHYSICAL THERAPY TREATMENT NOTE   Patient Name: Brooke Mejia MRN: [id number] DOB:Aug 13, 1990, 31 y.o., female Today's Date: 01/20/2022  PCP: Dulce Sellar, NP REFERRING PROVIDER: Terie Purser, NP   END OF SESSION:   PT End of Session - 01/20/22 1511     Visit Number 3    Number of Visits 6    Date for PT Re-Evaluation 02/24/22    Authorization Type WC    Authorization - Visit Number 3    Authorization - Number of Visits 6    PT Start Time 1505    PT Stop Time 1603    PT Time Calculation (min) 58 min    Activity Tolerance Patient tolerated treatment well    Behavior During Therapy [facility] for tasks assessed/performed              Past Medical History:  Diagnosis Date   Trichimoniasis    Past Surgical History:  Procedure Laterality Date   HAND SURGERY     Patient Active Problem List   Diagnosis Date Noted   Adjustment insomnia 07/18/2021   Moderate major depression, single episode ([facility]) 05/15/2021   Persistent headaches 05/15/2021   Abnormal cervical Papanicolaou smear 03/02/2018   Left ovarian cyst 06/24/2017    REFERRING DIAG: S40.012A (ICD-10-CM) - Contusion of left shoulder, initial encounter S16.1XXA (ICD-10-CM) - Strain of muscle, fascia and tendon at neck level, initial encounter   THERAPY DIAG:  Acute pain of left shoulder  Muscle weakness (generalized)  Cramp and spasm  Rationale for Evaluation and Treatment Rehabilitation    SUBJECTIVE:                                                                                                                                                                                      SUBJECTIVE STATEMENT: Pt reports her R shoulder has been feeling better after the last session. She was more active yesterday with her R UE, and it was sore last eveening. Overall, she feels like she is beginning to make some improvement.   PERTINENT HISTORY: None relevant    PAIN:  Are you having pain? Yes: NPRS  scale: 3/10 Pain location: L shoulder  Pain description: pulling, aching, sore Aggravating factors: laying on it  Relieving factors: sling, meds, heating pad  Not medicated    PRECAUTIONS: None   WEIGHT BEARING RESTRICTIONS No   FALLS:  Has patient fallen in last 6 months? 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 Vacation upcoming and wants to go to school as surgical tech next month.    OBJECTIVE: (objective measures completed at initial evaluation unless otherwise dated)   DIAGNOSTIC FINDINGS:  FINDINGS: There is no evidence of fracture or dislocation. There is no evidence of arthropathy or other focal bone abnormality. Soft tissues are unremarkable.   IMPRESSION: Negative.     PATIENT SURVEYS:  FOTO 42% DASH NT   COGNITION:           Overall cognitive status: Within functional limits for tasks assessed                                  SENSATION: WFL   POSTURE: WNL    UPPER EXTREMITY ROM:    Active ROM Right eval Left eval  Shoulder flexion   98  Shoulder extension      Shoulder abduction   90  Shoulder adduction      Shoulder internal rotation   FR to mid back  PROM WNL with pain   Shoulder external rotation   FR NT PROM WNL with pain   Elbow flexion      Elbow extension      Wrist flexion      Wrist extension      Wrist ulnar deviation      Wrist radial deviation      Wrist pronation      Wrist supination      (Blank rows = not tested)   UPPER EXTREMITY MMT:   MMT Right eval Left eval  Shoulder flexion   NT due to pain   Shoulder extension      Shoulder abduction   NT due to pain   Shoulder adduction      Shoulder internal rotation   4+  Shoulder external rotation   4+ pain   Middle trapezius       Lower trapezius      Elbow flexion      Elbow extension      Wrist flexion      Wrist extension      Wrist ulnar deviation      Wrist radial deviation      Wrist pronation      Wrist supination      Grip strength (lbs)      (Blank rows = not tested)   SHOULDER SPECIAL TESTS:            Impingement tests:  NT            SLAP lesions:  NT            Instability tests:  NT            Rotator cuff assessment:  NT            Biceps assessment:  NT   JOINT MOBILITY TESTING:  guarded   PALPATION:  Pain L AC joint along clavicle and into shoulder blade , deltoid anterior and middle             TODAY'S TREATMENT:  [facility] Adult PT Treatment:                                                DATE:  01/20/22 Therapeutic Exercise: Therapeutic Exercise: - Seated Shoulder Flexion Towel Slide at Table Top 10 reps - 10 hold - Seated Scapular Retraction  10 reps - 5 hold - Seated Upper Trapezius Stretch 4 reps - 20 hold - Shoulder row 2x10 RTB   Modalities: Moist heat to the L shoulder and neck x15 mins s/p TPDN  Trigger Point Dry Needling Treatment: Pre-treatment instruction: Patient instructed on dry needling rationale, procedures, and possible side effects including pain during treatment (achy,cramping feeling), bruising, drop of blood, lightheadedness, nausea, sweating. Patient Consent Given: Yes Education handout provided: Yes Muscles treated: L upper trap  Needle size and number: .30x43mm x 1 Electrical stimulation performed: No Parameters: N/A Treatment response/outcome: Twitch response elicited Post-treatment instructions: Patient instructed to expect possible mild to moderate muscle soreness later today and/or tomorrow. Patient instructed in methods to reduce muscle soreness and to continue prescribed HEP. If patient was dry needled over the lung field, patient was instructed on signs and symptoms of pneumothorax and, however unlikely, to see immediate medical attention should they  occur. Patient was also educated on signs and symptoms of infection and to seek medical attention should they occur. Patient verbalized understanding of these instructions and education.   [facility] Adult PT Treatment:                                                DATE: 01/16/22 Therapeutic Exercise: - Seated Shoulder Flexion Towel Slide at Table Top 10 reps - 10-15 hold - Seated Scapular Retraction  10 reps - 5 hold - Seated Gentle Upper Trapezius Stretch 4 reps - 20 holh  Manual Therapy: STM to the L upper shoulder and neck with increased muscle tension and TrPs noted Modalities: Moist heat to the L shoulder and neck x15 mins s/p TPDN  Trigger Point Dry Needling Treatment: Pre-treatment instruction: Patient instructed on dry needling rationale, procedures, and possible side effects including pain during treatment (achy,cramping feeling), bruising, drop of blood, lightheadedness, nausea, sweating. Patient Consent Given: Yes Education handout provided: Yes Muscles treated: L upper trap  Needle size and number: .30x62mm x 1 Electrical stimulation performed: No Parameters: N/A Treatment response/outcome: Twitch response elicited Post-treatment instructions: Patient instructed to expect possible mild to moderate muscle soreness later today and/or tomorrow. Patient instructed in methods to reduce muscle soreness and to continue prescribed HEP. If patient was dry needled over the lung field, patient was instructed on signs and symptoms of pneumothorax and, however unlikely, to see immediate medical attention should they occur. Patient was also educated on signs and symptoms of infection and to seek medical attention should they occur. Patient verbalized understanding of these instructions and education.   Eval Treatment: PT eval, HEP    PATIENT EDUCATION: Education details:HEP, sling, nerve tension, ROM, strength inhibited by pain  Person educated: Patient Education method: Explanation, Tactile  cues, Verbal cues, and Handouts Education comprehension: verbalized understanding and needs further education     HOME EXERCISE PROGRAM: Access Code: [email or web address] URL: [email or web address] Date: 01/20/2022 Prepared by: Joellyn Rued  Exercises - Seated Shoulder Flexion Towel Slide at Table Top  - 2 x daily - 7 x weekly - 2 sets - 10 reps - 10-15 hold - Seated Shoulder Abduction Towel Slide at Table Top  - 2 x daily - 7 x weekly - 2 sets - 10 reps - 10-15 hold - Seated Scapular  Retraction  - 2-3 x daily - 7 x weekly - 2 sets - 10 reps - Seated Gentle Upper Trapezius Stretch  - 2 x daily - 7 x weekly - 1 sets - 3-5 reps - 30 hold - Supine Shoulder Flexion AAROM  - 2 x daily - 7 x weekly - 2 sets - 10 reps - 5 hold - Standing Shoulder Row with Anchored Resistance  - 2 x daily - 7 x weekly - 2 sets - 10 reps - 3 hold   ASSESSMENT:   CLINICAL IMPRESSION: Pt experienced a positive response to the initial TPDN treatment with reported improved flexibility and lower pain of the L shoulder. Pt continues to present with an injury acute in nature. PT was completed for STM to the L neck and upper shoulder f/b TPDN to the L upper trap. Muscle twitches were achieved. Following TPDN, pt reported an increase in upper trap pain following it's course to the upper cervical region. Therex was then completed for upper trap stretching and posterior chain strengthening. Moist heat was then provided for symptom management.  OBJECTIVE IMPAIRMENTS decreased activity tolerance, decreased mobility, decreased ROM, decreased strength, increased fascial restrictions, increased muscle spasms, impaired flexibility, impaired UE functional use, and pain.    ACTIVITY LIMITATIONS carrying, lifting, bending, sitting, standing, sleeping, reach over head, hygiene/grooming, and caring for others   PARTICIPATION LIMITATIONS: meal prep, cleaning, laundry, interpersonal relationship, driving, shopping, community activity,  occupation, yard work, and school   PERSONAL FACTORS Profession and 1 comorbidity: none   are also affecting patient's functional outcome.    REHAB POTENTIAL: Excellent   CLINICAL DECISION MAKING: Stable/uncomplicated   EVALUATION COMPLEXITY: Low     GOALS: Goals reviewed with patient? Yes     LONG TERM GOALS: Target date: 02/24/2022  (Remove Blue Hyperlink)   Patient will be I with HEP for  shoulder ROM and strength  Baseline: given on eval  Goal status: INITIAL   2.  Pt will be able to lift arm overhead with min increase in pain  Baseline: unable > 98 deg  Goal status: INITIAL   3.  Pt will be able to carry, lift items with L UE < 10 lbs for improved work tolerance  Baseline: avoids  Goal status: INITIAL   4.  Pt will be abel to demo 4/5 or more strength in L UE for optimizing shoulder function at work and home.  Baseline:  Goal status: INITIAL   5.  Pt will be without the sling and pain minimized at rest  Baseline:  Goal status: INITIAL       PLAN: PT FREQUENCY: 2x/week   PT DURATION: 6 weeks   PLANNED INTERVENTIONS: Therapeutic exercises, Therapeutic activity, Neuromuscular re-education, Balance training, Gait training, Patient/Family education, Joint mobilization, Dry Needling, Electrical stimulation, Cryotherapy, Moist heat, Taping, Ultrasound, Ionotophoresis 4mg /ml Dexamethasone, Manual therapy, and Re-evaluation   PLAN FOR NEXT SESSION: check HEP and advance to dowel, AAROM. Manual .  Consider DN to L scap, upper trap   MS, PT 01/20/22 5:56 PM

## 2022-01-22 ENCOUNTER — Encounter: Payer: Self-pay | Admitting: Physical Therapy

## 2022-01-22 ENCOUNTER — Ambulatory Visit: Payer: PRIVATE HEALTH INSURANCE | Attending: Nurse Practitioner | Admitting: Physical Therapy

## 2022-01-22 DIAGNOSIS — M25512 Pain in left shoulder: Secondary | ICD-10-CM | POA: Diagnosis not present

## 2022-01-22 DIAGNOSIS — M6281 Muscle weakness (generalized): Secondary | ICD-10-CM | POA: Diagnosis present

## 2022-01-22 DIAGNOSIS — R252 Cramp and spasm: Secondary | ICD-10-CM | POA: Diagnosis present

## 2022-01-22 NOTE — Therapy (Signed)
OUTPATIENT PHYSICAL THERAPY TREATMENT NOTE   Patient Name: Brooke Mejia MRN: [id number] DOB:1990-11-09, 31 y.o., female Today's Date: 01/22/2022  PCP: Dulce Sellar, NP REFERRING PROVIDER: Terie Purser, NP   END OF SESSION:   PT End of Session - 01/22/22 1232     Visit Number 4    Number of Visits 6    Date for PT Re-Evaluation 02/24/22    Authorization Type WC    Authorization - Visit Number 4    Authorization - Number of Visits 6    PT Start Time 1225    PT Stop Time 1320    PT Time Calculation (min) 55 min    Activity Tolerance Patient tolerated treatment well    Behavior During Therapy [facility] for tasks assessed/performed               Past Medical History:  Diagnosis Date   Trichimoniasis    Past Surgical History:  Procedure Laterality Date   HAND SURGERY     Patient Active Problem List   Diagnosis Date Noted   Adjustment insomnia 07/18/2021   Moderate major depression, single episode (HCC) 05/15/2021   Persistent headaches 05/15/2021   Abnormal cervical Papanicolaou smear 03/02/2018   Left ovarian cyst 06/24/2017    REFERRING DIAG: S40.012A (ICD-10-CM) - Contusion of left shoulder, initial encounter S16.1XXA (ICD-10-CM) - Strain of muscle, fascia and tendon at neck level, initial encounter   THERAPY DIAG:  Acute pain of left shoulder  Muscle weakness (generalized)  Cramp and spasm  Rationale for Evaluation and Treatment Rehabilitation    SUBJECTIVE:                                                                                                                                                                                      SUBJECTIVE STATEMENT: The 2nd dry needling did not help.  Spasm today, I think I was sleeping on it last night.    PERTINENT HISTORY: None relevant    PAIN:  Are you having pain? Yes: NPRS scale: 7/10 Pain location: L shoulder  Pain description: pulling, aching, sore Aggravating factors: laying on it  Relieving  factors: sling, meds, heating pad  Not medicated - takes after session    PRECAUTIONS: None   WEIGHT BEARING RESTRICTIONS No   FALLS:  Has patient fallen in last 6 months? 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 Vacation upcoming  and wants to go to school as surgical tech next month.    OBJECTIVE: (objective measures completed at initial evaluation unless otherwise dated)   DIAGNOSTIC FINDINGS:  FINDINGS: There is no evidence of fracture or dislocation. There is no evidence of arthropathy or other focal bone abnormality. Soft tissues are unremarkable.   IMPRESSION: Negative.     PATIENT SURVEYS:  FOTO 42% DASH NT   COGNITION:           Overall cognitive status: Within functional limits for tasks assessed                                  SENSATION: WFL   POSTURE: WNL    UPPER EXTREMITY ROM:    Active ROM Right eval Left eval  Shoulder flexion   98  Shoulder extension      Shoulder abduction   90  Shoulder adduction      Shoulder internal rotation   FR to mid back  PROM WNL with pain   Shoulder external rotation   FR NT PROM WNL with pain   Elbow flexion      Elbow extension      Wrist flexion      Wrist extension      Wrist ulnar deviation      Wrist radial deviation      Wrist pronation      Wrist supination      (Blank rows = not tested)   UPPER EXTREMITY MMT:   MMT Right eval Left eval  Shoulder flexion   NT due to pain   Shoulder extension      Shoulder abduction   NT due to pain   Shoulder adduction      Shoulder internal rotation   4+  Shoulder external rotation   4+ pain   Middle trapezius      Lower trapezius      Elbow flexion      Elbow extension      Wrist flexion      Wrist extension       Wrist ulnar deviation      Wrist radial deviation      Wrist pronation      Wrist supination      Grip strength (lbs)      (Blank rows = not tested)   SHOULDER SPECIAL TESTS:            Impingement tests:  NT            SLAP lesions:  NT            Instability tests:  NT            Rotator cuff assessment:  NT            Biceps assessment:  NT   JOINT MOBILITY TESTING:  guarded   PALPATION:  Pain L AC joint along clavicle and into shoulder blade , deltoid anterior and middle             TODAY'S TREATMENT:    [facility] Adult PT Treatment:                                                DATE: 01/22/22 Therapeutic Exercise: Pendulum 3 min  Pulleys 3 min scaption Isometric  extension, flexion, abduction and ER/IR with PT assisting , 5 sec x 10  Manual Therapy:   PROM all planes to tolerance  Modalities: IFC L shoulder A/P 15 min with MHP  Self Care: Try meds prior to session to tolerate more activity Muscle pain vs joint IFC , home tens    [facility]:                                                DATE: 01/20/22 Therapeutic Exercise: Therapeutic Exercise: - Seated Shoulder Flexion Towel Slide at Table Top 10 reps - 10 hold - Seated Scapular Retraction  10 reps - 5 hold - Seated Upper Trapezius Stretch 4 reps - 20 hold - Shoulder row 2x10 RTB   Modalities: Moist heat to the L shoulder and neck x15 mins s/p TPDN  Trigger Point Dry Needling Treatment: Pre-treatment instruction: Patient instructed on dry needling rationale, procedures, and possible side effects including pain during treatment (achy,cramping feeling), bruising, drop of blood, lightheadedness, nausea, sweating. Patient Consent Given: Yes Education handout provided: Yes Muscles treated: L upper trap  Needle size and number: .30x4850mm x 1 Electrical stimulation performed: No Parameters: N/A Treatment response/outcome: Twitch response elicited Post-treatment instructions: Patient instructed to  expect possible mild to moderate muscle soreness later today and/or tomorrow. Patient instructed in methods to reduce muscle soreness and to continue prescribed HEP. If patient was dry needled over the lung field, patient was instructed on signs and symptoms of pneumothorax and, however unlikely, to see immediate medical attention should they occur. Patient was also educated on signs and symptoms of infection and to seek medical attention should they occur. Patient verbalized understanding of these instructions and education.   [facility]:                                                DATE: 01/16/22 Therapeutic Exercise: - Seated Shoulder Flexion Towel Slide at Table Top 10 reps - 10-15 hold - Seated Scapular Retraction  10 reps - 5 hold - Seated Gentle Upper Trapezius Stretch 4 reps - 20 holh  Manual Therapy: STM to the L upper shoulder and neck with increased muscle tension and TrPs noted Modalities: Moist heat to the L shoulder and neck x15 mins s/p TPDN  Trigger Point Dry Needling Treatment: Pre-treatment instruction: Patient instructed on dry needling rationale, procedures, and possible side effects including pain during treatment (achy,cramping feeling), bruising, drop of blood, lightheadedness, nausea, sweating. Patient Consent Given: Yes Education handout provided: Yes Muscles treated: L upper trap  Needle size and number: .30x650mm x 1 Electrical stimulation performed: No Parameters: N/A Treatment response/outcome: Twitch response elicited Post-treatment instructions: Patient instructed to expect possible mild to moderate muscle soreness later today and/or tomorrow. Patient instructed in methods to reduce muscle soreness and to continue prescribed HEP. If patient was dry needled over the lung field, patient was instructed on signs and symptoms of pneumothorax and, however unlikely, to see immediate medical attention should they occur. Patient was also educated on signs and  symptoms of infection and to seek medical attention should they occur. Patient verbalized understanding of these instructions and education.   Eval Treatment: PT eval, HEP    PATIENT EDUCATION:  Education details:HEP, sling, nerve tension, ROM, strength inhibited by pain  Person educated: Patient Education method: Explanation, Tactile cues, Verbal cues, and Handouts Education comprehension: verbalized understanding and needs further education     HOME EXERCISE PROGRAM: Access Code: [email or web address] Access Code: [email or web address] URL: [email or web address] Date: 01/22/2022 Prepared by: Karie Mainland  Exercises - Seated Shoulder Flexion Towel Slide at Table Top  - 2 x daily - 7 x weekly - 2 sets - 10 reps - 10-15 hold - Seated Shoulder Abduction Towel Slide at Table Top  - 2 x daily - 7 x weekly - 2 sets - 10 reps - 10-15 hold - Seated Scapular Retraction  - 2-3 x daily - 7 x weekly - 2 sets - 10 reps - Seated Gentle Upper Trapezius Stretch  - 2 x daily - 7 x weekly - 1 sets - 3-5 reps - 30 hold - Supine Shoulder Flexion AAROM  - 2 x daily - 7 x weekly - 2 sets - 10 reps - 5 hold - Standing Shoulder Row with Anchored Resistance  - 2 x daily - 7 x weekly - 2 sets - 10 reps - 3 hold - Isometric Shoulder Flexion at Wall  - 1 x daily - 7 x weekly - 2 sets - 10 reps - 5 hold - Isometric Shoulder Abduction at Wall  - 1 x daily - 7 x weekly - 2 sets - 10 reps - 5 hold - Isometric Shoulder External Rotation at Wall  - 1 x daily - 7 x weekly - 2 sets - 10 reps - 5 hold   ASSESSMENT:   CLINICAL IMPRESSION: Patient with increased pain today.  Intermittent muscle spasms during session.  Did not tolerate soft tissue work but offered IFC with MHP to reduce spasm and pain .  Began isometrics for L UE added to HEP.    OBJECTIVE IMPAIRMENTS decreased activity tolerance, decreased mobility, decreased ROM, decreased strength, increased fascial restrictions, increased muscle spasms, impaired flexibility,  impaired UE functional use, and pain.    ACTIVITY LIMITATIONS carrying, lifting, bending, sitting, standing, sleeping, reach over head, hygiene/grooming, and caring for others   PARTICIPATION LIMITATIONS: meal prep, cleaning, laundry, interpersonal relationship, driving, shopping, community activity, occupation, yard work, and school   PERSONAL FACTORS Profession and 1 comorbidity: none   are also affecting patient's functional outcome.    REHAB POTENTIAL: Excellent   CLINICAL DECISION MAKING: Stable/uncomplicated   EVALUATION COMPLEXITY: Low     GOALS: Goals reviewed with patient? Yes     LONG TERM GOALS: Target date: 02/24/2022  (Remove Blue Hyperlink)   Patient will be I with HEP for  shoulder ROM and strength  Baseline: given on eval  Goal status: INITIAL   2.  Pt will be able to lift arm overhead with min increase in pain  Baseline: unable > 98 deg  Goal status: INITIAL   3.  Pt will be able to carry, lift items with L UE < 10 lbs for improved work tolerance  Baseline: avoids  Goal status: INITIAL   4.  Pt will be abel to demo 4/5 or more strength in L UE for optimizing shoulder function at work and home.  Baseline:  Goal status: INITIAL   5.  Pt will be without the sling and pain minimized at rest  Baseline:  Goal status: INITIAL       PLAN: PT FREQUENCY: 2x/week   PT DURATION: 6 weeks   PLANNED INTERVENTIONS: Therapeutic exercises, Therapeutic activity, Neuromuscular re-education, Balance  training, Gait training, Patient/Family education, Joint mobilization, Dry Needling, Electrical stimulation, Cryotherapy, Moist heat, Taping, Ultrasound, Ionotophoresis 4mg /ml Dexamethasone, Manual therapy, and Re-evaluation   PLAN FOR NEXT SESSION: isometrics, cont AAROM as tolerated.  Did IFC help?  advance to dowel  , PT 01/22/22 1:14 PM Phone: [phone] Fax: [phone]

## 2022-01-27 ENCOUNTER — Ambulatory Visit: Payer: PRIVATE HEALTH INSURANCE | Admitting: Physical Therapy

## 2022-01-29 ENCOUNTER — Telehealth: Payer: Self-pay

## 2022-01-29 ENCOUNTER — Ambulatory Visit: Payer: PRIVATE HEALTH INSURANCE | Attending: Nurse Practitioner

## 2022-01-29 NOTE — Telephone Encounter (Signed)
LVM re: no show appt for today. Reminded pt of her scheduled appt at [facility] for tomorrow.

## 2022-01-30 ENCOUNTER — Ambulatory Visit: Payer: PRIVATE HEALTH INSURANCE

## 2022-01-30 DIAGNOSIS — M25512 Pain in left shoulder: Secondary | ICD-10-CM | POA: Diagnosis not present

## 2022-01-30 DIAGNOSIS — M6281 Muscle weakness (generalized): Secondary | ICD-10-CM

## 2022-01-30 DIAGNOSIS — R252 Cramp and spasm: Secondary | ICD-10-CM

## 2022-01-30 NOTE — Therapy (Signed)
OUTPATIENT PHYSICAL THERAPY TREATMENT NOTE   Patient Name: CARLEE VONDERHAAR MRN: [id number] DOB:January 05, 1991, 31 y.o., female Today's Date: 01/30/2022  PCP: Dulce Sellar, NP REFERRING PROVIDER: Terie Purser, NP   END OF SESSION:   PT End of Session - 01/30/22 1315     Visit Number 5    Number of Visits 6    Date for PT Re-Evaluation 02/24/22    Authorization Type WC    Authorization - Visit Number 5    Authorization - Number of Visits 6    PT Start Time 1316    PT Stop Time 1400    PT Time Calculation (min) 44 min    Activity Tolerance Patient tolerated treatment well    Behavior During Therapy [facility] for tasks assessed/performed                Past Medical History:  Diagnosis Date   Trichimoniasis    Past Surgical History:  Procedure Laterality Date   HAND SURGERY     Patient Active Problem List   Diagnosis Date Noted   Adjustment insomnia 07/18/2021   Moderate major depression, single episode (HCC) 05/15/2021   Persistent headaches 05/15/2021   Abnormal cervical Papanicolaou smear 03/02/2018   Left ovarian cyst 06/24/2017    REFERRING DIAG: S40.012A (ICD-10-CM) - Contusion of left shoulder, initial encounter S16.1XXA (ICD-10-CM) - Strain of muscle, fascia and tendon at neck level, initial encounter   THERAPY DIAG:  Acute pain of left shoulder  Muscle weakness (generalized)  Cramp and spasm  Rationale for Evaluation and Treatment Rehabilitation    SUBJECTIVE:                                                                                                                                                                                      SUBJECTIVE STATEMENT:  Pt reports her L shoulder is feeling better. Still has moments of significant pain. She is out of the sling about 75% of the time.  PERTINENT HISTORY: None relevant    PAIN:  Are you having pain? Yes: NPRS scale: 0/10, took 600 mg of ibuprofen prior to PT Pain location: L shoulder  Pain  description: pulling, aching, sore Aggravating factors: laying on it  Relieving factors: sling, meds, heating pad  Not medicated - takes after session    PRECAUTIONS: None   WEIGHT BEARING RESTRICTIONS No   FALLS:  Has patient fallen in last 6 months? 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 Vacation upcoming and wants to go to school as surgical tech next month.    OBJECTIVE: (objective measures completed at initial evaluation unless otherwise dated)   DIAGNOSTIC FINDINGS:  FINDINGS: There is no evidence of fracture or dislocation. There is no evidence of arthropathy or other focal bone abnormality. Soft tissues are unremarkable.   IMPRESSION: Negative.     PATIENT SURVEYS:  FOTO 42% DASH NT   COGNITION:           Overall cognitive status: Within functional limits for tasks assessed                                  SENSATION: WFL   POSTURE: WNL    UPPER EXTREMITY ROM:    Active ROM Right eval Left eval Rt  Shoulder flexion   98 PROM 150; AROM 140  Shoulder extension       Shoulder abduction   90 A 115  Shoulder adduction       Shoulder internal rotation   FR to mid back  PROM WNL with pain    Shoulder external rotation   FR NT PROM WNL with pain    Elbow flexion       Elbow extension       Wrist flexion       Wrist extension       Wrist ulnar deviation       Wrist radial deviation       Wrist pronation       Wrist supination       (Blank rows = not tested)   UPPER EXTREMITY MMT:   MMT Right eval Left eval  Shoulder flexion   NT due to pain   Shoulder extension      Shoulder abduction   NT due to pain   Shoulder adduction      Shoulder internal rotation   4+  Shoulder external rotation   4+ pain    Middle trapezius      Lower trapezius      Elbow flexion      Elbow extension      Wrist flexion      Wrist extension      Wrist ulnar deviation      Wrist radial deviation      Wrist pronation      Wrist supination      Grip strength (lbs)      (Blank rows = not tested)   SHOULDER SPECIAL TESTS:            Impingement tests:  NT            SLAP lesions:  NT            Instability tests:  NT            Rotator cuff assessment:  NT            Biceps assessment:  NT   JOINT MOBILITY TESTING:  guarded   PALPATION:  Pain L AC joint along clavicle and into shoulder blade , deltoid anterior and middle             TODAY'S TREATMENT:  [facility] Adult PT Treatment:  DATE: 01/29/22 Therapeutic Exercise: Pulley for shoulder flexion 2 mns UBE forward 2 mins Shoulder flexion table top support 2x10 Upper trap  stretch x5 10" Assessment of active and AAROM shoulder flexion  Modalities: Vaso 15 mins, low pressure, 36d   [facility] Adult PT Treatment:                                                DATE: 01/22/22 Therapeutic Exercise: Pendulum 3 min  Pulleys 3 min scaption Isometric extension, flexion, abduction and ER/IR with PT assisting , 5 sec x 10  Manual Therapy:   PROM all planes to tolerance  Modalities: IFC L shoulder A/P 15 min with MHP  Self Care: Try meds prior to session to tolerate more activity Muscle pain vs joint IFC , home tens    [facility] Adult PT Treatment:                                                DATE: 01/20/22 Therapeutic Exercise: Therapeutic Exercise: - Seated Shoulder Flexion Towel Slide at Table Top 10 reps - 10 hold - Seated Scapular Retraction  10 reps - 5 hold - Seated Upper Trapezius Stretch 4 reps - 20 hold - Shoulder row 2x10 RTB   Modalities: Moist heat to the L shoulder and neck x15 mins s/p TPDN  Trigger Point Dry Needling Treatment: Pre-treatment instruction: Patient instructed on dry needling  rationale, procedures, and possible side effects including pain during treatment (achy,cramping feeling), bruising, drop of blood, lightheadedness, nausea, sweating. Patient Consent Given: Yes Education handout provided: Yes Muscles treated: L upper trap  Needle size and number: .30x54mm x 1 Electrical stimulation performed: No Parameters: N/A Treatment response/outcome: Twitch response elicited Post-treatment instructions: Patient instructed to expect possible mild to moderate muscle soreness later today and/or tomorrow. Patient instructed in methods to reduce muscle soreness and to continue prescribed HEP. If patient was dry needled over the lung field, patient was instructed on signs and symptoms of pneumothorax and, however unlikely, to see immediate medical attention should they occur. Patient was also educated on signs and symptoms of infection and to seek medical attention should they occur. Patient verbalized understanding of these instructions and education.  Eval Treatment: PT eval, HEP    PATIENT EDUCATION: Education details:HEP, sling, nerve tension, ROM, strength inhibited by pain  Person educated: Patient Education method: Explanation, Tactile cues, Verbal cues, and Handouts Education comprehension: verbalized understanding and needs further education     HOME EXERCISE PROGRAM: Access Code: [email or web address] Access Code: [email or web address] URL: [email or web address] Date: 01/22/2022 Prepared by: Karie Mainland  Exercises - Seated Shoulder Flexion Towel Slide at Table Top  - 2 x daily - 7 x weekly - 2 sets - 10 reps - 10-15 hold - Seated Shoulder Abduction Towel Slide at Table Top  - 2 x daily - 7 x weekly - 2 sets - 10 reps - 10-15 hold - Seated Scapular Retraction  - 2-3 x daily - 7 x weekly - 2 sets - 10 reps - Seated Gentle Upper Trapezius Stretch  - 2 x daily - 7 x weekly - 1 sets - 3-5 reps - 30 hold - Supine Shoulder Flexion AAROM  - 2 x daily - 7 x weekly -  2 sets - 10  reps - 5 hold - Standing Shoulder Row with Anchored Resistance  - 2 x daily - 7 x weekly - 2 sets - 10 reps - 3 hold - Isometric Shoulder Flexion at Wall  - 1 x daily - 7 x weekly - 2 sets - 10 reps - 5 hold - Isometric Shoulder Abduction at Wall  - 1 x daily - 7 x weekly - 2 sets - 10 reps - 5 hold - Isometric Shoulder External Rotation at Wall  - 1 x daily - 7 x weekly - 2 sets - 10 reps - 5 hold   ASSESSMENT:   CLINICAL IMPRESSION: Pt presents today with pain improved form last week and decreased use of the shoulder sling. With use of ibuprofen prior to PT, pt reported 0/10 pain. PT was completed for AROM and AAROM of the L shoulder and light resistance with the UBE. Pt then reported an increase in L shoulder pain to 6/10. Vaso cold/compression was then applied for symptom management. Pt reported her L shoulder felt better after the Vaso. Pt's L shoulder AROM and AAROM are both improved in comparison to the eval, but tolerance to light shoulder exs was limited. Pt's L shoulder function and pain are better than last week, but are acute in nature. Pt will continie to benefit from skilled PT to address impairments to optimize function for RTW.  OBJECTIVE IMPAIRMENTS decreased activity tolerance, decreased mobility, decreased ROM, decreased strength, increased fascial restrictions, increased muscle spasms, impaired flexibility, impaired UE functional use, and pain.    ACTIVITY LIMITATIONS carrying, lifting, bending, sitting, standing, sleeping, reach over head, hygiene/grooming, and caring for others   PARTICIPATION LIMITATIONS: meal prep, cleaning, laundry, interpersonal relationship, driving, shopping, community activity, occupation, yard work, and school   PERSONAL FACTORS Profession and 1 comorbidity: none   are also affecting patient's functional outcome.    REHAB POTENTIAL: Excellent   CLINICAL DECISION MAKING: Stable/uncomplicated   EVALUATION COMPLEXITY: Low     GOALS: Goals  reviewed with patient? Yes     LONG TERM GOALS: Target date: 02/24/2022  (Remove Blue Hyperlink)   Patient will be I with HEP for  shoulder ROM and strength  Baseline: given on eval  Goal status: INITIAL   2.  Pt will be able to lift arm overhead with min increase in pain  Baseline: unable > 98 deg  Goal status: INITIAL   3.  Pt will be able to carry, lift items with L UE < 10 lbs for improved work tolerance  Baseline: avoids  Goal status: INITIAL   4.  Pt will be abel to demo 4/5 or more strength in L UE for optimizing shoulder function at work and home.  Baseline:  Goal status: INITIAL   5.  Pt will be without the sling and pain minimized at rest  Baseline:  Goal status: INITIAL       PLAN: PT FREQUENCY: 2x/week   PT DURATION: 6 weeks   PLANNED INTERVENTIONS: Therapeutic exercises, Therapeutic activity, Neuromuscular re-education, Balance training, Gait training, Patient/Family education, Joint mobilization, Dry Needling, Electrical stimulation, Cryotherapy, Moist heat, Taping, Ultrasound, Ionotophoresis 4mg /ml Dexamethasone, Manual therapy, and Re-evaluation   PLAN FOR NEXT SESSION: isometrics, cont AAROM as tolerated.  Did IFC help?  advance to dowel  MS, PT 01/30/22 2:29 PM

## 2022-02-03 ENCOUNTER — Ambulatory Visit: Payer: PRIVATE HEALTH INSURANCE | Attending: Nurse Practitioner

## 2022-02-03 ENCOUNTER — Other Ambulatory Visit (HOSPITAL_COMMUNITY): Payer: Self-pay

## 2022-02-03 DIAGNOSIS — M6281 Muscle weakness (generalized): Secondary | ICD-10-CM | POA: Insufficient documentation

## 2022-02-03 DIAGNOSIS — R252 Cramp and spasm: Secondary | ICD-10-CM | POA: Diagnosis present

## 2022-02-03 DIAGNOSIS — M25512 Pain in left shoulder: Secondary | ICD-10-CM | POA: Diagnosis present

## 2022-02-03 MED ORDER — CYCLOBENZAPRINE HCL 5 MG PO TABS
5.0000 mg | ORAL_TABLET | Freq: Every evening | ORAL | 0 refills | Status: DC | PRN
  Filled 2022-02-03: qty 30, 30d supply, fill #0

## 2022-02-03 MED ORDER — METHYLPREDNISOLONE 4 MG PO TBPK
ORAL_TABLET | ORAL | 0 refills | Status: DC
  Filled 2022-02-03: qty 21, 6d supply, fill #0

## 2022-02-03 NOTE — Therapy (Signed)
OUTPATIENT PHYSICAL THERAPY TREATMENT NOTE   Patient Name: Brooke Mejia MRN: [id number] DOB:03-Sep-1990, 31 y.o., female Today's Date: 02/03/2022  PCP: Dulce Sellar, NP REFERRING PROVIDER: Terie Purser, NP   END OF SESSION:   PT End of Session - 02/03/22 1107     Visit Number 6    Date for PT Re-Evaluation 02/24/22    Authorization Type WC    Authorization - Visit Number 6    Authorization - Number of Visits 6    Progress Note Due on Visit 10    PT Start Time 1017    PT Stop Time 1112    PT Time Calculation (min) 55 min    Activity Tolerance Patient tolerated treatment well    Behavior During Therapy [facility] for tasks assessed/performed                 Past Medical History:  Diagnosis Date   Trichimoniasis    Past Surgical History:  Procedure Laterality Date   HAND SURGERY     Patient Active Problem List   Diagnosis Date Noted   Adjustment insomnia 07/18/2021   Moderate major depression, single episode (HCC) 05/15/2021   Persistent headaches 05/15/2021   Abnormal cervical Papanicolaou smear 03/02/2018   Left ovarian cyst 06/24/2017    REFERRING DIAG: S40.012A (ICD-10-CM) - Contusion of left shoulder, initial encounter S16.1XXA (ICD-10-CM) - Strain of muscle, fascia and tendon at neck level, initial encounter   THERAPY DIAG:  Acute pain of left shoulder  Muscle weakness (generalized)  Cramp and spasm  Rationale for Evaluation and Treatment Rehabilitation  SUBJECTIVE:                                                                                                                                                         SUBJECTIVE STATEMENT:  Getting better, but use of R shoulder/UE increases her pain. Pt notes she does not wear the L shoulder sling at home, but does out in the community.  PERTINENT HISTORY: None relevant    PAIN:  Are you having pain? Yes: NPRS scale: 3/10, took 600 mg of ibuprofen prior to PT Pain location: L shoulder  Pain  description: pulling, aching, sore Aggravating factors: laying on it  Relieving factors: sling, meds, heating pad  Not medicated - takes after session    PRECAUTIONS: None   WEIGHT BEARING RESTRICTIONS No   FALLS:  Has patient fallen in last 6 months? 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 Vacation upcoming and wants to go to school as surgical tech next month.  OBJECTIVE: (objective measures completed at initial evaluation unless otherwise dated)   DIAGNOSTIC FINDINGS:  FINDINGS: There is no evidence of fracture or dislocation. There is no evidence of arthropathy or other focal bone abnormality. Soft tissues are unremarkable.   IMPRESSION: Negative.     PATIENT SURVEYS:  FOTO 42% DASH NT   COGNITION:           Overall cognitive status: Within functional limits for tasks assessed                                  SENSATION: WFL   POSTURE: WNL    UPPER EXTREMITY ROM:    Active ROM Right eval Left eval Rt 01/30/22 Rt 02/03/22  Shoulder flexion   98 PROM 150; AROM 140 AROM 145  Shoulder extension        Shoulder abduction   90 A 115   Shoulder adduction        Shoulder internal rotation   FR to mid back  PROM WNL with pain     Shoulder external rotation   FR NT PROM WNL with pain     Elbow flexion        Elbow extension        Wrist flexion        Wrist extension        Wrist ulnar deviation        Wrist radial deviation        Wrist pronation        Wrist supination        (Blank rows = not tested)   UPPER EXTREMITY MMT:   MMT Right eval Left eval  Shoulder flexion   NT due to pain   Shoulder extension      Shoulder abduction   NT due to pain   Shoulder adduction      Shoulder internal rotation   4+   Shoulder external rotation   4+ pain   Middle trapezius      Lower trapezius      Elbow flexion      Elbow extension      Wrist flexion      Wrist extension      Wrist ulnar deviation      Wrist radial deviation      Wrist pronation      Wrist supination      Grip strength (lbs)      (Blank rows = not tested)   SHOULDER SPECIAL TESTS:            Impingement tests:  NT            SLAP lesions:  NT            Instability tests:  NT            Rotator cuff assessment:  NT            Biceps assessment:  NT   JOINT MOBILITY TESTING:  guarded   PALPATION:  Pain L AC joint along clavicle and into shoulder blade , deltoid anterior and middle             TODAY'S TREATMENT:  [facility] Adult PT Treatment:  DATE: 02/03/22 Therapeutic Exercise: Pulley for shoulder flexion 2 mns UBE forward 1.5  mins forward and backwards Supine chest press c dowel x15 Supine shoulder flexion/ext c dowel x10 Upper trap stretch x5 10" Levator stretch x5 10" Scalene stretch x5 10" L shoulder flexion wall slide x10   Modalities: Moist heat to the L shoulder, neck, upper shoulder, and upper back 15 mins  [facility] Adult PT Treatment:                                                DATE: 01/29/22 Therapeutic Exercise: Pulley for shoulder flexion 2 mns UBE forward 2 mins Shoulder flexion table top support 2x10 Upper trap  stretch x5 10" Assessment of active and AAROM shoulder flexion  Modalities: Vaso 15 mins, low pressure, 36d   [facility] Adult PT Treatment:                                                DATE: 01/22/22 Therapeutic Exercise: Pendulum 3 min  Pulleys 3 min scaption Isometric extension, flexion, abduction and ER/IR with PT assisting , 5 sec x 10  Manual Therapy:   PROM all planes to tolerance  Modalities: IFC L shoulder A/P 15 min with MHP  Self Care: Try meds prior to session to tolerate more activity Muscle pain vs joint IFC , home tens  .  Eval Treatment: PT eval, HEP    PATIENT EDUCATION: Education details:HEP, sling, nerve tension, ROM, strength inhibited by pain  Person educated: Patient Education method: Explanation, Tactile cues, Verbal cues, and Handouts Education comprehension: verbalized understanding and needs further education     HOME EXERCISE PROGRAM: Access Code: [email or web address] Access Code: [email or web address] URL: [email or web address] Date: 01/22/2022 Prepared by: Karie Mainland  Exercises - Seated Shoulder Flexion Towel Slide at Table Top  - 2 x daily - 7 x weekly - 2 sets - 10 reps - 10-15 hold - Seated Shoulder Abduction Towel Slide at Table Top  - 2 x daily - 7 x weekly - 2 sets - 10 reps - 10-15 hold - Seated Scapular Retraction  - 2-3 x daily - 7 x weekly - 2 sets - 10 reps - Seated Gentle Upper Trapezius Stretch  - 2 x daily - 7 x weekly - 1 sets - 3-5 reps - 30 hold - Supine Shoulder Flexion AAROM  - 2 x daily - 7 x weekly - 2 sets - 10 reps - 5 hold - Standing Shoulder Row with Anchored Resistance  - 2 x daily - 7 x weekly - 2 sets - 10 reps - 3 hold - Isometric Shoulder Flexion at Wall  - 1 x daily - 7 x weekly - 2 sets - 10 reps - 5 hold - Isometric Shoulder Abduction at Wall  - 1 x daily - 7 x weekly - 2 sets - 10 reps - 5 hold - Isometric Shoulder External Rotation at Wall  - 1 x daily - 7 x weekly - 2 sets - 10 reps - 5 hold   ASSESSMENT:   CLINICAL IMPRESSION: Pt presents with her L upper quarter injury slowly improving, but still appearing acute in nature. Pt's pain and use of her L UE are decreased as long as use  of the L UE is limited. In PT today, gentle AAROM and AROM therex were completed, and the pt reported an increased in pain and muscle spasms. Pt's L shoulder AROM has increase from not being able to tested due to pain on eval to 145d. The quality of motion with L shoulder flexion is decreased, being completed at a slow pace. Pt has an appt c [facility] today or tomorrow. Pt  will continue to benefit from skilled PT and will continue with PT as she is referred back for continuation of PT services.  OBJECTIVE IMPAIRM todayENTS decreased activity tolerance, decreased mobility, decreased ROM, decreased strength, increased fascial restrictions, increased muscle spasms, impaired flexibility, impaired UE functional use, and pain.    ACTIVITY LIMITATIONS carrying, lifting, bending, sitting, standing, sleeping, reach over head, hygiene/grooming, and caring for others   PARTICIPATION LIMITATIONS: meal prep, cleaning, laundry, interpersonal relationship, driving, shopping, community activity, occupation, yard work, and school   [place] and 1 comorbidity: none   are also affecting patient's functional outcome.    REHAB POTENTIAL: Excellent   CLINICAL DECISION MAKING: Stable/uncomplicated   EVALUATION COMPLEXITY: Low     GOALS: Goals reviewed with patient? Yes     LONG TERM GOALS: Target date: 02/24/2022  (Remove Blue Hyperlink)   Patient will be I with HEP for  shoulder ROM and strength  Baseline: given on eval  Goal status: INITIAL   2.  Pt will be able to lift arm overhead with min increase in pain  Baseline: unable > 98 deg  Goal status: INITIAL   3.  Pt will be able to carry, lift items with L UE < 10 lbs for improved work tolerance  Baseline: avoids  Goal status: INITIAL   4.  Pt will be abel to demo 4/5 or more strength in L UE for optimizing shoulder function at work and home.  Baseline:  Goal status: INITIAL   5.  Pt will be without the sling and pain minimized at rest  Baseline:  Goal status: INITIAL       PLAN: PT FREQUENCY: 2x/week   PT DURATION: 6 weeks   PLANNED INTERVENTIONS: Therapeutic exercises, Therapeutic activity, Neuromuscular re-education, Balance training, Gait training, Patient/Family education, Joint mobilization, Dry Needling, Electrical stimulation, Cryotherapy, Moist heat, Taping, Ultrasound,  Ionotophoresis 4mg /ml Dexamethasone, Manual therapy, and Re-evaluation   PLAN FOR NEXT SESSION: isometrics, cont AAROM as tolerated.  Did IFC help?  advance to dowel  Gar Ponto MS, PT 02/03/22 3:08 PM

## 2022-02-05 ENCOUNTER — Other Ambulatory Visit (HOSPITAL_COMMUNITY): Payer: Self-pay

## 2022-02-05 ENCOUNTER — Ambulatory Visit: Payer: PRIVATE HEALTH INSURANCE

## 2022-02-05 DIAGNOSIS — M6281 Muscle weakness (generalized): Secondary | ICD-10-CM

## 2022-02-05 DIAGNOSIS — R252 Cramp and spasm: Secondary | ICD-10-CM

## 2022-02-05 DIAGNOSIS — M25512 Pain in left shoulder: Secondary | ICD-10-CM | POA: Diagnosis not present

## 2022-02-05 NOTE — Therapy (Signed)
OUTPATIENT PHYSICAL THERAPY TREATMENT NOTE   Patient Name: Brooke Mejia MRN: [id number] DOB:[birth year], 31 y.o., female Today's Date: 02/05/2022  PCP: Dulce Sellar, NP REFERRING PROVIDER: Terie Purser, NP   END OF SESSION:   PT End of Session - 02/05/22 1718     Visit Number 6    Number of Visits 9    Date for PT Re-Evaluation 02/24/22    Authorization Type WC    Authorization - Visit Number 6    Authorization - Number of Visits 9    Progress Note Due on Visit 10    PT Start Time 1506    PT Stop Time 1601    PT Time Calculation (min) 55 min    Activity Tolerance Patient tolerated treatment well    Behavior During Therapy [facility] for tasks assessed/performed                  Past Medical History:  Diagnosis Date   Trichimoniasis    Past Surgical History:  Procedure Laterality Date   HAND SURGERY     Patient Active Problem List   Diagnosis Date Noted   Adjustment insomnia 07/18/2021   Moderate major depression, single episode (HCC) 05/15/2021   Persistent headaches 05/15/2021   Abnormal cervical Papanicolaou smear 03/02/2018   Left ovarian cyst 06/24/2017    REFERRING DIAG: S40.012A (ICD-10-CM) - Contusion of left shoulder, initial encounter S16.1XXA (ICD-10-CM) - Strain of muscle, fascia and tendon at neck level, initial encounter   THERAPY DIAG:  Acute pain of left shoulder  Muscle weakness (generalized)  Cramp and spasm  Rationale for Evaluation and Treatment Rehabilitation  SUBJECTIVE:                                                                                                                                                         SUBJECTIVE STATEMENT: Pt reports she has been approved for 3 more PT sessions. She notes she did some house cleaning yesterday and her L shoulder has not felt as good. She reports more spasms today. Pt reports she has been started on dose pack. She returns back to [facility] on 8/11.  PERTINENT HISTORY: None  relevant    PAIN:  Are you having pain? Yes: NPRS scale: 7/10, took 600 mg of ibuprofen prior to PT Pain location: L shoulder  Pain description: pulling, aching, sore Aggravating factors: laying on it  Relieving factors: sling, meds, heating pad  Not medicated - takes after session    PRECAUTIONS: None   WEIGHT BEARING RESTRICTIONS No   FALLS:  Has patient fallen in last 6 months? 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 Vacation upcoming and wants to go to school as surgical tech next month.    OBJECTIVE: (objective measures completed at initial evaluation unless otherwise dated)   DIAGNOSTIC FINDINGS:  FINDINGS: There is no evidence of fracture or dislocation. There is no evidence of arthropathy or other focal bone abnormality. Soft tissues are unremarkable.   IMPRESSION: Negative.     PATIENT SURVEYS:  FOTO 42% DASH NT   COGNITION:           Overall cognitive status: Within functional limits for tasks assessed                                  SENSATION: WFL   POSTURE: WNL    UPPER EXTREMITY ROM:    Active ROM Right eval Left eval Rt 01/30/22 Rt 02/03/22  Shoulder flexion   98 PROM 150; AROM 140 AROM 145  Shoulder extension        Shoulder abduction   90 A 115   Shoulder adduction        Shoulder internal rotation   FR to mid back  PROM WNL with pain     Shoulder external rotation   FR NT PROM WNL with pain     Elbow flexion        Elbow extension        Wrist flexion        Wrist extension        Wrist ulnar deviation        Wrist radial deviation        Wrist pronation        Wrist supination        (Blank rows = not tested)   UPPER EXTREMITY MMT:   MMT Right eval Left eval  Shoulder flexion   NT due  to pain   Shoulder extension      Shoulder abduction   NT due to pain   Shoulder adduction      Shoulder internal rotation   4+  Shoulder external rotation   4+ pain   Middle trapezius      Lower trapezius      Elbow flexion      Elbow extension      Wrist flexion      Wrist extension      Wrist ulnar deviation      Wrist radial deviation      Wrist pronation      Wrist supination      Grip strength (lbs)      (Blank rows = not tested)   SHOULDER SPECIAL TESTS:            Impingement tests:  NT            SLAP lesions:  NT            Instability tests:  NT            Rotator cuff assessment:  NT            Biceps assessment:  NT   JOINT MOBILITY TESTING:  guarded   PALPATION:  Pain L AC joint along clavicle and into shoulder blade , deltoid anterior and middle             TODAY'S TREATMENT:  [facility] Adult PT Treatment:  DATE: 02/05/22 Therapeutic Exercise: L Upper trap stretch x3 20" L scalene trap stretch x3 20" Shoulder row 2x10 RTB NuStep 5 mins Manual Therapy STM and DTM with emphasis on tight muscle bands for the upper trap, scalenes, and levator  Modalities: Moist heat to the L shoulder, neck, upper shoulder, and upper back 15 mi  [facility] Adult PT Treatment:                                                DATE: 02/03/22 Therapeutic Exercise: Pulley for shoulder flexion 2 mns UBE forward 1.5  mins forward and backwards Supine chest press c dowel x15 Supine shoulder flexion/ext c dowel x10 Upper trap stretch x5 10" Levator stretch x5 10" Scalene stretch x5 10" L shoulder flexion wall slide x10   Modalities: Moist heat to the L shoulder, neck, upper shoulder, and upper back 15 mins  [facility] Adult PT Treatment:                                                DATE: 01/29/22 Therapeutic Exercise: Pulley for shoulder flexion 2 mns UBE forward 2 mins Shoulder flexion table top support 2x10 Upper trap  stretch x5  10" Assessment of active and AAROM shoulder flexion  Modalities: Vaso 15 mins, low pressure, 36d    PATIENT EDUCATION: Education details:HEP, sling, nerve tension, ROM, strength inhibited by pain  Person educated: Patient Education method: Explanation, Actor cues, Verbal cues, and Handouts Education comprehension: verbalized understanding and needs further education     HOME EXERCISE PROGRAM: Access Code: [email or web address] Access Code: [email or web address] URL: [email or web address] Date: 01/22/2022 Prepared by: Karie Mainland  Exercises - Seated Shoulder Flexion Towel Slide at Table Top  - 2 x daily - 7 x weekly - 2 sets - 10 reps - 10-15 hold - Seated Shoulder Abduction Towel Slide at Table Top  - 2 x daily - 7 x weekly - 2 sets - 10 reps - 10-15 hold - Seated Scapular Retraction  - 2-3 x daily - 7 x weekly - 2 sets - 10 reps - Seated Gentle Upper Trapezius Stretch  - 2 x daily - 7 x weekly - 1 sets - 3-5 reps - 30 hold - Supine Shoulder Flexion AAROM  - 2 x daily - 7 x weekly - 2 sets - 10 reps - 5 hold - Standing Shoulder Row with Anchored Resistance  - 2 x daily - 7 x weekly - 2 sets - 10 reps - 3 hold - Isometric Shoulder Flexion at Wall  - 1 x daily - 7 x weekly - 2 sets - 10 reps - 5 hold - Isometric Shoulder Abduction at Wall  - 1 x daily - 7 x weekly - 2 sets - 10 reps - 5 hold - Isometric Shoulder External Rotation at Wall  - 1 x daily - 7 x weekly - 2 sets - 10 reps - 5 hold   ASSESSMENT:   CLINICAL IMPRESSION: Pt presents with increased L shoulder today following completing some house cleaning yesterday. STM/DTM was provided to the L upper trap, scalenes, and levator f/b stretching and the initiation of posterior chain strengthening. Pt's L shoulder continues to be easily aggravated by low activity of the L  UE. Pt tolerated the session without adverse effect. Pt will continue to benefit form skilled PT to promote active use and strength of the L arm    OBJECTIVE IMPAIRM  todayENTS decreased activity tolerance, decreased mobility, decreased ROM, decreased strength, increased fascial restrictions, increased muscle spasms, impaired flexibility, impaired UE functional use, and pain.    ACTIVITY LIMITATIONS carrying, lifting, bending, sitting, standing, sleeping, reach over head, hygiene/grooming, and caring for others   PARTICIPATION LIMITATIONS: meal prep, cleaning, laundry, interpersonal relationship, driving, shopping, community activity, occupation, yard work, and school   PERSONAL FACTORS Profession and 1 comorbidity: none   are also affecting patient's functional outcome.    REHAB POTENTIAL: Excellent   CLINICAL DECISION MAKING: Stable/uncomplicated   EVALUATION COMPLEXITY: Low     GOALS: Goals reviewed with patient? Yes     LONG TERM GOALS: Target date: 02/24/2022  (Remove Blue Hyperlink)   Patient will be I with HEP for  shoulder ROM and strength  Baseline: given on eval  Goal status: INITIAL   2.  Pt will be able to lift arm overhead with min increase in pain  Baseline: unable > 98 deg  Goal status: INITIAL   3.  Pt will be able to carry, lift items with L UE < 10 lbs for improved work tolerance  Baseline: avoids  Goal status: INITIAL   4.  Pt will be abel to demo 4/5 or more strength in L UE for optimizing shoulder function at work and home.  Baseline:  Goal status: INITIAL   5.  Pt will be without the sling and pain minimized at rest  Baseline:  Goal status: INITIAL       PLAN: PT FREQUENCY: 2x/week   PT DURATION: 6 weeks   PLANNED INTERVENTIONS: Therapeutic exercises, Therapeutic activity, Neuromuscular re-education, Balance training, Gait training, Patient/Family education, Joint mobilization, Dry Needling, Electrical stimulation, Cryotherapy, Moist heat, Taping, Ultrasound, Ionotophoresis 4mg /ml Dexamethasone, Manual therapy, and Re-evaluation   PLAN FOR NEXT SESSION: isometrics, cont AAROM as tolerated.  Did IFC help?   advance to dowel. Assess LTGs  MS, PT 02/05/22 5:41 PM

## 2022-02-06 ENCOUNTER — Other Ambulatory Visit (HOSPITAL_COMMUNITY): Payer: Self-pay

## 2022-02-11 ENCOUNTER — Ambulatory Visit: Payer: 59

## 2022-02-11 NOTE — Therapy (Addendum)
OUTPATIENT PHYSICAL THERAPY TREATMENT NOTE/DC   Patient Name: Brooke Mejia MRN: [id number] DOB:May 10, 1991, 31 y.o., female Today's Date: 02/12/2022  PCP: Dulce Sellar, NP REFERRING PROVIDER: Terie Purser, NP   END OF SESSION:   PT End of Session - 02/12/22 1642     Visit Number 7    Number of Visits 17    Date for PT Re-Evaluation 02/24/22    Authorization Type WC    Authorization - Visit Number 2    Authorization - Number of Visits 12    Progress Note Due on Visit 10    PT Start Time 1637    PT Stop Time 1735    PT Time Calculation (min) 58 min    Activity Tolerance Patient tolerated treatment well;Patient limited by pain    Behavior During Therapy [facility] for tasks assessed/performed                   Past Medical History:  Diagnosis Date   Trichimoniasis    Past Surgical History:  Procedure Laterality Date   HAND SURGERY     Patient Active Problem List   Diagnosis Date Noted   Adjustment insomnia 07/18/2021   Moderate major depression, single episode (HCC) 05/15/2021   Persistent headaches 05/15/2021   Abnormal cervical Papanicolaou smear 03/02/2018   Left ovarian cyst 06/24/2017    REFERRING DIAG: S40.012A (ICD-10-CM) - Contusion of left shoulder, initial encounter S16.1XXA (ICD-10-CM) - Strain of muscle, fascia and tendon at neck level, initial encounter   THERAPY DIAG:  Acute pain of left shoulder  Muscle weakness (generalized)  Cramp and spasm  Rationale for Evaluation and Treatment Rehabilitation  SUBJECTIVE:                                                                                                                                                         SUBJECTIVE STATEMENT: Pt reports the dose pack has been helpful with the muscle spasms, but she is having soreness which she relates to not taking ibuprofen while on the dose pack. She returns back to [employer] tomorrow.  PERTINENT HISTORY: None relevant    PAIN:  Are you  having pain? Yes: NPRS scale: 5/10,  Pain location: L shoulder  Pain description: pulling, aching, sore Aggravating factors: laying on it  Relieving factors: sling, meds, heating pad  Not medicated - takes after session    PRECAUTIONS: None   WEIGHT BEARING RESTRICTIONS No   FALLS:  Has patient fallen in last 6 months? 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  Vacation upcoming and wants to go to school as surgical tech next month.    OBJECTIVE: (objective measures completed at initial evaluation unless otherwise dated)   DIAGNOSTIC FINDINGS:  FINDINGS: There is no evidence of fracture or dislocation. There is no evidence of arthropathy or other focal bone abnormality. Soft tissues are unremarkable.   IMPRESSION: Negative.     PATIENT SURVEYS:  FOTO 42% DASH NT   COGNITION:           Overall cognitive status: Within functional limits for tasks assessed                                  SENSATION: WFL   POSTURE: WNL    UPPER EXTREMITY ROM:    Active ROM Right eval Left eval Rt 01/30/22 Rt 02/03/22 RT 02/12/22 LT 02/12/22  Shoulder flexion   98 PROM 150; AROM 140 AROM 145 145 140  Shoulder extension          Shoulder abduction   90 A 115  140 140  Shoulder adduction          Shoulder internal rotation   FR to mid back  PROM WNL with pain       Shoulder external rotation   FR NT PROM WNL with pain       Elbow flexion          Elbow extension          Wrist flexion          Wrist extension          Wrist ulnar deviation          Wrist radial deviation          Wrist pronation          Wrist supination          (Blank rows = not tested)   UPPER EXTREMITY MMT:   MMT Right eval Left eval  Shoulder flexion   NT due  to pain   Shoulder extension      Shoulder abduction   NT due to pain   Shoulder adduction      Shoulder internal rotation   4+  Shoulder external rotation   4+ pain   Middle trapezius      Lower trapezius      Elbow flexion      Elbow extension      Wrist flexion      Wrist extension      Wrist ulnar deviation      Wrist radial deviation      Wrist pronation      Wrist supination      Grip strength (lbs)      (Blank rows = not tested)   SHOULDER SPECIAL TESTS:            Impingement tests:  NT            SLAP lesions:  NT            Instability tests:  NT            Rotator cuff assessment:  NT            Biceps assessment:  NT   JOINT MOBILITY TESTING:  guarded   PALPATION:  Pain L AC joint along clavicle and into shoulder blade , deltoid anterior and middle  TODAY'S TREATMENT:  [facility] Adult PT Treatment:                                                DATE: 02/12/22 Therapeutic Exercise: UBE 2 mins in each L1 L Upper trap stretch x2 20" L scalene trap stretch x2 20" Shoulder row 2x10 RTB Shoulder press 2x10 RTB 60d doorway stretch x 30" Standing bilat shoulder ER 2x10 YTB  [facility] Adult PT Treatment:                                                DATE: 02/05/22 Therapeutic Exercise: L Upper trap stretch x3 20" L scalene trap stretch x3 20" Shoulder row 2x10 RTB NuStep 5 mins Manual Therapy STM and DTM with emphasis on tight muscle bands for the upper trap, scalenes, and levator  Modalities: Moist heat to the L shoulder, neck, upper shoulder, and upper back 15 mi  [facility] Adult PT Treatment:                                                DATE: 02/03/22 Therapeutic Exercise: Pulley for shoulder flexion 2 mns UBE forward 1.5  mins forward and backwards Supine chest press c dowel x15 Supine shoulder flexion/ext c dowel x10 Upper trap stretch x5 10" Levator stretch x5 10" Scalene stretch x5 10" L shoulder flexion wall slide x10   Modalities: Moist heat to  the L shoulder, neck, upper shoulder, and upper back 15 mins  [facility] Adult PT Treatment:                                                DATE: 01/29/22 Therapeutic Exercise: Pulley for shoulder flexion 2 mns UBE forward 2 mins Shoulder flexion table top support 2x10 Upper trap  stretch x5 10" Assessment of active and AAROM shoulder flexion  Modalities: Vaso 15 mins, low pressure, 36d    PATIENT EDUCATION: Education details:HEP, sling, nerve tension, ROM, strength inhibited by pain  Person educated: Patient Education method: Explanation, Actor cues, Verbal cues, and Handouts Education comprehension: verbalized understanding and needs further education     HOME EXERCISE PROGRAM: Access Code: [email or web address] Access Code: [email or web address] URL: [email or web address] Date: 01/22/2022 Prepared by: Karie Mainland  Exercises - Seated Shoulder Flexion Towel Slide at Table Top  - 2 x daily - 7 x weekly - 2 sets - 10 reps - 10-15 hold - Seated Shoulder Abduction Towel Slide at Table Top  - 2 x daily - 7 x weekly - 2 sets - 10 reps - 10-15 hold - Seated Scapular Retraction  - 2-3 x daily - 7 x weekly - 2 sets - 10 reps - Seated Gentle Upper Trapezius Stretch  - 2 x daily - 7 x weekly - 1 sets - 3-5 reps - 30 hold - Supine Shoulder Flexion AAROM  - 2 x daily - 7 x weekly - 2 sets - 10 reps - 5 hold - Standing Shoulder Row with  Anchored Resistance  - 2 x daily - 7 x weekly - 2 sets - 10 reps - 3 hold - Isometric Shoulder Flexion at Wall  - 1 x daily - 7 x weekly - 2 sets - 10 reps - 5 hold - Isometric Shoulder Abduction at Wall  - 1 x daily - 7 x weekly - 2 sets - 10 reps - 5 hold - Isometric Shoulder External Rotation at Wall  - 1 x daily - 7 x weekly - 2 sets - 10 reps - 5 hold   ASSESSMENT:   CLINICAL IMPRESSION: Pt returns to PT after being on vacation. PT was completed for L shoulder/UE mobility and strengthening. Currently, pt is able to tolerate light resistance therex with some increase  in achiness. AROM of the L shoulder is at a normal level. Pt's L shoulder pain level is still significant, and pt holds her L UE in a protective posture close to her body.Pt is making slow, but appropriate progress. Pt will continue to benefit from skilled PT to address impairments for optimized function of her L shoulder/UE with less pain.  OBJECTIVE IMPAIRM todayENTS decreased activity tolerance, decreased mobility, decreased ROM, decreased strength, increased fascial restrictions, increased muscle spasms, impaired flexibility, impaired UE functional use, and pain.    ACTIVITY LIMITATIONS carrying, lifting, bending, sitting, standing, sleeping, reach over head, hygiene/grooming, and caring for others   PARTICIPATION LIMITATIONS: meal prep, cleaning, laundry, interpersonal relationship, driving, shopping, community activity, occupation, yard work, and school   PERSONAL FACTORS Profession and 1 comorbidity: none   are also affecting patient's functional outcome.    REHAB POTENTIAL: Excellent   CLINICAL DECISION MAKING: Stable/uncomplicated   EVALUATION COMPLEXITY: Low     GOALS: Goals reviewed with patient? Yes     LONG TERM GOALS: Target date: 02/24/2022  (Remove Blue Hyperlink)   Patient will be I with HEP for  shoulder ROM and strength  Baseline: given on eval  Goal status: Ongoing   2.  Pt will be able to lift arm overhead with min increase in pain  Baseline: unable > 98 deg  Status: 140 c pain Goal status: Partially MET    3.  Pt will be able to carry, lift items with L UE < 10 lbs for improved work tolerance  Baseline: avoids  Goal status: Ongoing   4.  Pt will be abel to demo 4/5 or more strength in L UE for optimizing shoulder function at work and home.  Baseline:  Status:3/5 Goal status: Ongoing   5.  Pt will be without the sling and pain minimized at rest  Baseline:  Goal status: MET       PLAN: PT FREQUENCY: 2x/week   PT DURATION: 6 weeks   PLANNED  INTERVENTIONS: Therapeutic exercises, Therapeutic activity, Neuromuscular re-education, Balance training, Gait training, Patient/Family education, Joint mobilization, Dry Needling, Electrical stimulation, Cryotherapy, Moist heat, Taping, Ultrasound, Ionotophoresis 4mg /ml Dexamethasone, Manual therapy, and Re-evaluation   PLAN FOR NEXT SESSION: isometrics, cont AAROM as tolerated.  Did IFC help?  advance to dowel.  Jeda Pardue MS, PT 02/12/22 6:06 PM  PHYSICAL THERAPY DISCHARGE SUMMARY  Visits from Start of Care: 7  Current functional level related to goals / functional outcomes: unknown   Remaining deficits: unknown   Education / Equipment: HEP   Patient agrees to discharge. Patient goals were partially met. Patient is being discharged due to not returning since the last visit.  Chasady Longwell MS, PT 08/19/23 4:27 PM

## 2022-02-12 ENCOUNTER — Ambulatory Visit: Payer: 59 | Attending: Nurse Practitioner

## 2022-02-12 DIAGNOSIS — R252 Cramp and spasm: Secondary | ICD-10-CM | POA: Diagnosis not present

## 2022-02-12 DIAGNOSIS — M25512 Pain in left shoulder: Secondary | ICD-10-CM | POA: Insufficient documentation

## 2022-02-12 DIAGNOSIS — M6281 Muscle weakness (generalized): Secondary | ICD-10-CM | POA: Insufficient documentation

## 2022-02-23 ENCOUNTER — Other Ambulatory Visit (HOSPITAL_COMMUNITY): Payer: Self-pay

## 2022-02-23 DIAGNOSIS — Z3202 Encounter for pregnancy test, result negative: Secondary | ICD-10-CM | POA: Diagnosis not present

## 2022-02-23 DIAGNOSIS — Z1151 Encounter for screening for human papillomavirus (HPV): Secondary | ICD-10-CM | POA: Diagnosis not present

## 2022-02-23 DIAGNOSIS — Z130 Encounter for screening for diseases of the blood and blood-forming organs and certain disorders involving the immune mechanism: Secondary | ICD-10-CM | POA: Diagnosis not present

## 2022-02-23 DIAGNOSIS — Z6826 Body mass index (BMI) 26.0-26.9, adult: Secondary | ICD-10-CM | POA: Diagnosis not present

## 2022-02-23 DIAGNOSIS — N760 Acute vaginitis: Secondary | ICD-10-CM | POA: Diagnosis not present

## 2022-02-23 DIAGNOSIS — Z1389 Encounter for screening for other disorder: Secondary | ICD-10-CM | POA: Diagnosis not present

## 2022-02-23 DIAGNOSIS — Z113 Encounter for screening for infections with a predominantly sexual mode of transmission: Secondary | ICD-10-CM | POA: Diagnosis not present

## 2022-02-23 DIAGNOSIS — R87810 Cervical high risk human papillomavirus (HPV) DNA test positive: Secondary | ICD-10-CM | POA: Diagnosis not present

## 2022-02-23 DIAGNOSIS — Z01419 Encounter for gynecological examination (general) (routine) without abnormal findings: Secondary | ICD-10-CM | POA: Diagnosis not present

## 2022-02-23 DIAGNOSIS — Z124 Encounter for screening for malignant neoplasm of cervix: Secondary | ICD-10-CM | POA: Diagnosis not present

## 2022-02-23 LAB — HM PAP SMEAR: HM Pap smear: POSITIVE

## 2022-02-24 ENCOUNTER — Other Ambulatory Visit (HOSPITAL_COMMUNITY): Payer: Self-pay

## 2022-02-25 ENCOUNTER — Other Ambulatory Visit (HOSPITAL_COMMUNITY): Payer: Self-pay

## 2022-02-27 ENCOUNTER — Other Ambulatory Visit (HOSPITAL_COMMUNITY): Payer: Self-pay

## 2022-03-10 NOTE — Therapy (Signed)
OUTPATIENT PHYSICAL THERAPY TREATMENT NOTE/RE-EVAL   Patient Name: Brooke Mejia MRN: [id number] DOB:1990/10/16, 31 y.o., female Today's Date: 03/11/2022  PCP: Jeanie Sewer, NP REFERRING PROVIDER: Drucilla Chalet  END OF SESSION:   PT End of Session - 03/11/22 1025     Visit Number 8    Number of Visits 16    Date for PT Re-Evaluation 04/24/22    Authorization Type WC    Authorization - Visit Number 2    Authorization - Number of Visits 12    Progress Note Due on Visit 43    PT Start Time 0940   pt arrived late   PT Stop Time 1015    PT Time Calculation (min) 35 min    Activity Tolerance Patient tolerated treatment well;Patient limited by pain    Behavior During Therapy [facility] for tasks assessed/performed             Past Medical History:  Diagnosis Date   Trichimoniasis    Past Surgical History:  Procedure Laterality Date   HAND SURGERY     Patient Active Problem List   Diagnosis Date Noted   Adjustment insomnia 07/18/2021   Moderate major depression, single episode ([place]) 05/15/2021   Persistent headaches 05/15/2021   Abnormal cervical Papanicolaou smear 03/02/2018   Left ovarian cyst 06/24/2017    REFERRING DIAG: S40.012A (ICD-10-CM) - Contusion of left shoulder, initial encounter S16.1XXA (ICD-10-CM) - Strain of muscle, fascia and tendon at neck level, initial encounter    THERAPY DIAG:  Acute pain of left shoulder   Muscle weakness (generalized)   Cramp and spasm   Rationale for Evaluation and Treatment Rehabilitation   SUBJECTIVE:                                                                                                                                                         SUBJECTIVE STATEMENT: Pt states that her shoulder is doing ok. It is her first day back at work. She notices that she compensates when carrying her book bag. She can have some Lt shoulder pain when doing too much or lifting heavy objects.    PERTINENT HISTORY: None relevant     PAIN:  Are you having pain? Yes: NPRS scale: 0/10, up to 6 or 7 with active ROM Pain location: L shoulder  Pain description: pulling, aching, sore Aggravating factors: using the arm too much Relieving factors: rest, ibuprofen, heating pad  Not medicated - takes after session    PRECAUTIONS: None   WEIGHT BEARING RESTRICTIONS No   FALLS:  Has patient fallen in last 6 months? 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 There is no evidence of arthropathy or other focal bone abnormality. Soft tissues are unremarkable.   IMPRESSION: Negative.     PATIENT SURVEYS:  FOTO 42% DASH NT   COGNITION:           Overall cognitive status: Within functional limits for tasks assessed                                  SENSATION: WFL   POSTURE: WNL    UPPER EXTREMITY ROM:    Active ROM Right eval Left eval Rt 01/30/22 Rt 02/03/22 RT 02/12/22 LT 03/11/22  Shoulder flexion   98 PROM 150; AROM 140 AROM 145 145 140  Shoulder extension              Shoulder abduction   90 A 115   140 140-painful end range  Shoulder adduction              Shoulder internal rotation   FR to mid back  PROM WNL with pain        Reach behind back to opposite inferior angle +pain   Shoulder external rotation   FR NT PROM WNL with pain         Reach behind head to spine of scapula +pain   Elbow flexion              Elbow extension              Wrist flexion              Wrist extension              Wrist ulnar deviation              Wrist radial deviation               Wrist pronation              Wrist supination              (Blank rows = not tested)   UPPER EXTREMITY MMT: updated 03/11/22   MMT Right eval Left eval  Shoulder flexion  5/5 4/5  Shoulder extension      Shoulder abduction   4/5  Shoulder adduction      Shoulder internal rotation   4+  Shoulder external rotation   4+ pain   Middle trapezius      Lower trapezius      Elbow flexion      Elbow extension      Wrist flexion      Wrist extension      Wrist ulnar deviation      Wrist radial deviation      Wrist pronation      Wrist supination      Grip strength (lbs)      (Blank rows = not tested)   SHOULDER SPECIAL TESTS:            Impingement tests:  NT            SLAP lesions:  NT            Instability tests:  NT  Rotator cuff assessment:  NT            Biceps assessment:  NT   JOINT MOBILITY TESTING:  guarded   PALPATION:  Muscle spasm and tenderness Lt upper trap             TODAY'S TREATMENT:   03/11/22: Scap depression with arm at 90 deg flexion x10 reps Low rows red TB x10 reps, cues to avoid scap elevation Lt UE flexion slide on wall x10 reps Lt UE A/AROM with dowel x5 reps Discussed updates to goals  [facility] Adult PT Treatment:                                                DATE: 02/12/22 Therapeutic Exercise: UBE 2 mins in each L1 L Upper trap stretch x2 20" L scalene trap stretch x2 20" Shoulder row 2x10 RTB Shoulder press 2x10 RTB 60d doorway stretch x 30" Standing bilat shoulder ER 2x10 YTB   [facility] Adult PT Treatment:                                                DATE: 02/05/22 Therapeutic Exercise: L Upper trap stretch x3 20" L scalene trap stretch x3 20" Shoulder row 2x10 RTB NuStep 5 mins Manual Therapy STM and DTM with emphasis on tight muscle bands for the upper trap, scalenes, and levator  Modalities: Moist heat to the L shoulder, neck, upper shoulder, and upper back 15 mi   [facility] Adult PT Treatment:                                                 DATE: 02/03/22 Therapeutic Exercise: Pulley for shoulder flexion 2 mns UBE forward 1.5  mins forward and backwards Supine chest press c dowel x15 Supine shoulder flexion/ext c dowel x10 Upper trap stretch x5 10" Levator stretch x5 10" Scalene stretch x5 10" L shoulder flexion wall slide x10    Modalities: Moist heat to the L shoulder, neck, upper shoulder, and upper back 15 mins   [facility] Adult PT Treatment:                                                DATE: 01/29/22 Therapeutic Exercise: Pulley for shoulder flexion 2 mns UBE forward 2 mins Shoulder flexion table top support 2x10 Upper trap  stretch x5 10" Assessment of active and AAROM shoulder flexion   Modalities: Vaso 15 mins, low pressure, 36d    PATIENT EDUCATION: Education details:HEP, sling, nerve tension, ROM, strength inhibited by pain  Person educated: Patient Education method: Explanation, Corporate treasurer cues, Verbal cues, and Handouts Education comprehension: verbalized understanding and needs further education     HOME EXERCISE PROGRAM:      Access Code: [email or web address] URL: [email or web address] Date: 03/11/2022 Prepared by: [facility]  Exercises - Seated Gentle Upper Trapezius Stretch  - 2 x daily - 7 x weekly - 1 sets -  3-5 reps - 30 hold - Standing Shoulder Row with Anchored Resistance  - 2 x daily - 7 x weekly - 2 sets - 10 reps - 3 hold - Standing Single Arm Shoulder Flexion Stretch on Wall  - 2 x daily - 7 x weekly - 1 sets - 5-10 reps - Seated Shoulder Abduction AAROM with Dowel  - 2 x daily - 7 x weekly - 1 sets - 5 reps    ASSESSMENT:   CLINICAL IMPRESSION: Pt has made good progress since her PT evaluation several weeks ago. Her Lt shoulder active ROM is within functional limits and painful. Her Lt shoulder strength is 4/5 MMT. Today was her first day back to work and she has been hesitant to assist with pt transfers. Pt is not back at  the gym and complains of Lt shoulder pain/discomfort when carrying her book bag or overusing the arm. She would benefit from skilled PT to address her remaining limitations in shoulder ROM, strength and facilitate her full return to work and caring for her son without increase in pain.   OBJECTIVE IMPAIRM todayENTS decreased activity tolerance, decreased mobility, decreased ROM, decreased strength, increased fascial restrictions, increased muscle spasms, impaired flexibility, impaired UE functional use, and pain.    ACTIVITY LIMITATIONS carrying, lifting, bending, sitting, standing, sleeping, reach over head, hygiene/grooming, and caring for others   PARTICIPATION LIMITATIONS: meal prep, cleaning, laundry, interpersonal relationship, driving, shopping, community activity, occupation, yard work, and school   [address] and 1 comorbidity: none   are also affecting patient's functional outcome.    REHAB POTENTIAL: Excellent   CLINICAL DECISION MAKING: Stable/uncomplicated   EVALUATION COMPLEXITY: Low     GOALS: Goals reviewed with patient? Yes     LONG TERM GOALS: Target date: 02/24/2022  (Remove Blue Hyperlink)   Patient will be I with HEP for  shoulder ROM and strength  Baseline: given on eval  Goal status: Ongoing   2.  Pt will be able to lift arm overhead with min increase in pain  Baseline: unable > 98 deg  Status: 140 c pain Goal status: Partially MET     3.  Pt will be able to carry, lift items with L UE < 10 lbs for improved work tolerance  Baseline: avoids  Goal status: Ongoing   4.  Pt will be abel to demo 4/5 or more strength in L UE for optimizing shoulder function at work and home.  Baseline:  Status:3/5 Goal status: Ongoing   5.  Pt will be without the sling and pain minimized at rest  Baseline:  Goal status: MET  6.  Pt will be able to properly carry her book bag for school without increase in Lt shoulder pain. Baseline: unable  Goal status:  NEW  7.  Pt will be able to return to the gym and participate atleast 50% of her prior level without Lt shoulder pain.   Baseline: not back at the gym  Goals: NEW       PLAN: PT FREQUENCY: 1x/week   PT DURATION: 6 weeks   PLANNED INTERVENTIONS: Therapeutic exercises, Therapeutic activity, Neuromuscular re-education, Balance training, Gait training, Patient/Family education, Joint mobilization, Dry Needling, Electrical stimulation, Cryotherapy, Moist heat, Taping, Ultrasound, Ionotophoresis 17m/ml Dexamethasone, Manual therapy, and Re-evaluation   PLAN FOR NEXT SESSION: cont AAROM as tolerated.  scap control and strength progression, consider DN    10:36 AM,03/11/22 SSherol DadePT, [place]

## 2022-03-11 ENCOUNTER — Encounter: Payer: Self-pay | Admitting: Physical Therapy

## 2022-03-11 ENCOUNTER — Ambulatory Visit: Payer: PRIVATE HEALTH INSURANCE | Attending: Nurse Practitioner | Admitting: Physical Therapy

## 2022-03-11 DIAGNOSIS — M25512 Pain in left shoulder: Secondary | ICD-10-CM | POA: Insufficient documentation

## 2022-03-11 DIAGNOSIS — R252 Cramp and spasm: Secondary | ICD-10-CM | POA: Insufficient documentation

## 2022-03-11 DIAGNOSIS — M6281 Muscle weakness (generalized): Secondary | ICD-10-CM | POA: Insufficient documentation

## 2022-03-18 ENCOUNTER — Ambulatory Visit: Payer: PRIVATE HEALTH INSURANCE | Admitting: Physical Therapy

## 2022-03-19 ENCOUNTER — Encounter: Payer: 59 | Admitting: Physical Therapy

## 2022-03-20 ENCOUNTER — Ambulatory Visit: Payer: PRIVATE HEALTH INSURANCE | Admitting: Physical Therapy

## 2022-03-20 DIAGNOSIS — M25512 Pain in left shoulder: Secondary | ICD-10-CM | POA: Diagnosis not present

## 2022-03-20 DIAGNOSIS — M6281 Muscle weakness (generalized): Secondary | ICD-10-CM

## 2022-03-20 NOTE — Therapy (Signed)
OUTPATIENT PHYSICAL THERAPY TREATMENT NOTE/RE-EVAL   Patient Name: Brooke Mejia MRN: [id number] DOB:[birth year], 31 y.o., female Today's Date: 03/20/2022  PCP: Jeanie Sewer, NP REFERRING PROVIDER: Drucilla Chalet  END OF SESSION:   PT End of Session - 03/20/22 0938     Visit Number 9    Date for PT Re-Evaluation 04/24/22    Authorization Type WC    PT Start Time 0937    PT Stop Time 1015    PT Time Calculation (min) 38 min             Past Medical History:  Diagnosis Date   Trichimoniasis    Past Surgical History:  Procedure Laterality Date   HAND SURGERY     Patient Active Problem List   Diagnosis Date Noted   Adjustment insomnia 07/18/2021   Moderate major depression, single episode ([place]) 05/15/2021   Persistent headaches 05/15/2021   Abnormal cervical Papanicolaou smear 03/02/2018   Left ovarian cyst 06/24/2017    REFERRING DIAG: S40.012A (ICD-10-CM) - Contusion of left shoulder, initial encounter S16.1XXA (ICD-10-CM) - Strain of muscle, fascia and tendon at neck level, initial encounter    THERAPY DIAG:  Acute pain of left shoulder   Muscle weakness (generalized)   Cramp and spasm   Rationale for Evaluation and Treatment Rehabilitation   SUBJECTIVE:                                                                                                                                                         SUBJECTIVE STATEMENT: pt denies pain just soreness from full body workout at gym yesterday as she was told she could return to gym PERTINENT HISTORY:  None relevant    PAIN:  Are you having pain? Yes: NPRS scale: 0/10 Pain location: L shoulder  Pain description: pulling, aching, sore Aggravating factors: using the arm too much Relieving factors: rest, ibuprofen, heating pad  Not medicated - takes after session    PRECAUTIONS: None   WEIGHT BEARING RESTRICTIONS No   FALLS:  Has patient fallen in last 6 months? 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 There is no evidence of arthropathy or other focal bone abnormality. Soft tissues are unremarkable.   IMPRESSION: Negative.     PATIENT SURVEYS:  FOTO 42% DASH NT   COGNITION:           Overall  cognitive status: Within functional limits for tasks assessed                                  SENSATION: WFL   POSTURE: WNL    UPPER EXTREMITY ROM:    Active ROM Right eval Left eval Rt 01/30/22 Rt 02/03/22 RT 02/12/22 LT 03/11/22 Left  Standing  03/20/22  Shoulder flexion   98 PROM 150; AROM 140 AROM 145 145 140 170  Shoulder extension               Shoulder abduction   90 A 115   140 140-painful end range 170  Shoulder adduction               Shoulder internal rotation   FR to mid back  PROM WNL with pain        Reach behind back to opposite inferior angle +pain    Shoulder external rotation   FR NT PROM WNL with pain         Reach behind head to spine of scapula +pain    Elbow flexion               Elbow extension               Wrist flexion               Wrist extension               Wrist ulnar deviation               Wrist radial deviation               Wrist pronation               Wrist supination               (Blank rows = not tested)   UPPER EXTREMITY MMT: updated 03/11/22   MMT Right eval Left eval Left  03/20/22  Shoulder flexion  5/5 4/5 4+  Shoulder extension       Shoulder abduction   4/5 4+  Shoulder adduction       Shoulder internal rotation   4+ 4+  Shoulder external rotation   4+ pain  4+170  Middle  trapezius       Lower trapezius       Elbow flexion       Elbow extension       Wrist flexion       Wrist extension       Wrist ulnar deviation       Wrist radial deviation       Wrist pronation       Wrist supination       Grip strength (lbs)       (Blank rows = not tested)   SHOULDER SPECIAL TESTS:            Impingement tests:  NT            SLAP lesions:  NT            Instability tests:  NT            Rotator cuff assessment:  NT            Biceps assessment:  NT   JOINT MOBILITY TESTING:  guarded   PALPATION:  Muscle spasm and tenderness Lt upper trap  TODAY'S TREATMENT:   03/20/22  UBE L 3 3 min fwd/ 3 min backward Lat Pull 25# 10x- just soreness Seated Row 10x 20# with some pain- limited ROM Chest press 10# 10 x Cable pulleys shld ext 5# 10 x and row 10 x- no issues Tricep ext 20# 2 sets 10 Bicep curl 15# 2 sets 10 with postural cuing Seated row ,shld ext, horz abd 3# 8x 2 sets   03/11/22: Scap depression with arm at 90 deg flexion x10 reps Low rows red TB x10 reps, cues to avoid scap elevation Lt UE flexion slide on wall x10 reps Lt UE A/AROM with dowel x5 reps Discussed updates to goals  [facility] Adult PT Treatment:                                                DATE: 02/12/22 Therapeutic Exercise: UBE 2 mins in each L1 L Upper trap stretch x2 20" L scalene trap stretch x2 20" Shoulder row 2x10 RTB Shoulder press 2x10 RTB 60d doorway stretch x 30" Standing bilat shoulder ER 2x10 YTB   [facility] Adult PT Treatment:                                                DATE: 02/05/22 Therapeutic Exercise: L Upper trap stretch x3 20" L scalene trap stretch x3 20" Shoulder row 2x10 RTB NuStep 5 mins Manual Therapy STM and DTM with emphasis on tight muscle bands for the upper trap, scalenes, and levator  Modalities: Moist heat to the L shoulder, neck, upper shoulder, and upper back 15 mi   [facility] Adult PT Treatment:                                                 DATE: 02/03/22 Therapeutic Exercise: Pulley for shoulder flexion 2 mns UBE forward 1.5  mins forward and backwards Supine chest press c dowel x15 Supine shoulder flexion/ext c dowel x10 Upper trap stretch x5 10" Levator stretch x5 10" Scalene stretch x5 10" L shoulder flexion wall slide x10    Modalities: Moist heat to the L shoulder, neck, upper shoulder, and upper back 15 mins   [facility] Adult PT Treatment:                                                DATE: 01/29/22 Therapeutic Exercise: Pulley for shoulder flexion 2 mns UBE forward 2 mins Shoulder flexion table top support 2x10 Upper trap  stretch x5 10" Assessment of active and AAROM shoulder flexion   Modalities: Vaso 15 mins, low pressure, 36d    PATIENT EDUCATION: Education details:HEP, sling, nerve tension, ROM, strength inhibited by pain  Person educated: Patient Education method: Explanation, Corporate treasurer cues, Verbal cues, and Handouts Education comprehension: verbalized understanding and needs further education     HOME EXERCISE PROGRAM:      Access Code: [email or web address] URL: [email or web address] Date: 03/11/2022 Prepared by: [facility]  Exercises - Seated Gentle Upper Trapezius Stretch  - 2 x daily - 7 x weekly - 1 sets - 3-5 reps - 30 hold - Standing Shoulder Row with Anchored Resistance  - 2 x daily - 7 x weekly - 2 sets - 10 reps - 3 hold - Standing Single Arm Shoulder Flexion Stretch on Wall  - 2 x daily - 7 x weekly - 1 sets - 5-10 reps - Seated Shoulder Abduction AAROM with Dowel  - 2 x daily - 7 x weekly - 1 sets - 5 reps    ASSESSMENT:   CLINICAL IMPRESSION: pt arrived 7 min late. Denies pain just soreness that she feels is associated with full body work out at gym yesterday as she was [place] to return to gym. Progressed ex, educ on gym safety and answered questions. Pt stated did not do machines for UE so added those with educ.Progressing with  goals as marked below.   OBJECTIVE IMPAIRM todayENTS decreased activity tolerance, decreased mobility, decreased ROM, decreased strength, increased fascial restrictions, increased muscle spasms, impaired flexibility, impaired UE functional use, and pain.    ACTIVITY LIMITATIONS carrying, lifting, bending, sitting, standing, sleeping, reach over head, hygiene/grooming, and caring for others   PARTICIPATION LIMITATIONS: meal prep, cleaning, laundry, interpersonal relationship, driving, shopping, community activity, occupation, yard work, and school   [place] and 1 comorbidity: none   are also affecting patient's functional outcome.    REHAB POTENTIAL: Excellent   CLINICAL DECISION MAKING: Stable/uncomplicated   EVALUATION COMPLEXITY: Low     GOALS: Goals reviewed with patient? Yes     LONG TERM GOALS: Target date: 02/24/2022  (Remove Blue Hyperlink)   Patient will be I with HEP for  shoulder ROM and strength  Baseline: given on eval  Goal status: 03/20/22 MET   2.  Pt will be able to lift arm overhead with min increase in pain  Baseline: unable > 98 deg  Status: 140 c pain Goal status: Partially MET     3.  Pt will be able to carry, lift items with L UE < 10 lbs for improved work tolerance  Baseline: avoids  Goal status: Ongoing   4.  Pt will be abel to demo 4/5 or more strength in L UE for optimizing shoulder function at work and home.  Baseline:  Status:3/5 Goal status: 03/20/22 MET   5.  Pt will be without the sling and pain minimized at rest  Baseline:  Goal status: MET  6.  Pt will be able to properly carry her book bag for school without increase in Lt shoulder pain. Baseline: unable  Goal status: NEW  7.  Pt will be able to return to the gym and participate atleast 50% of her prior level without Lt shoulder pain.   Baseline: not back at the gym  Goals: 03/20/22 PROGRESSING       PLAN: PT FREQUENCY: 1x/week   PT DURATION: 6 weeks    PLANNED INTERVENTIONS: Therapeutic exercises, Therapeutic activity, Neuromuscular re-education, Balance training, Gait training, Patient/Family education, Joint mobilization, Dry Needling, Electrical stimulation, Cryotherapy, Moist heat, Taping, Ultrasound, Ionotophoresis 15m/ml Dexamethasone, Manual therapy, and Re-evaluation   PLAN FOR NEXT SESSION: cont AAROM as tolerated.  scap control and strength progression, consider DN    9:39 AM,03/20/22 SSherol DadePT, DPT CNeshobaat BEmma   [place] [address]Phone: [phone]  Fax:  [phone]  Patient Details  Name: Brooke Mejia MRN: [id number] Date of Birth: Mar 11, 1991 Referring Provider:  Jeanie Sewer, NP  Encounter Date: 03/20/2022   Laqueta Carina, PTA 03/20/2022, 9:39 AM  [place] [place], [place], [address] Phone: [phone]   Fax:  [phone]

## 2022-03-24 NOTE — Therapy (Signed)
OUTPATIENT PHYSICAL THERAPY TREATMENT NOTE   Patient Name: Brooke Mejia MRN: [id number] DOB:06-14-1991, 31 y.o., female Today's Date: 03/25/2022    END OF SESSION:   PT End of Session - 03/25/22 1015     Visit Number 10    Date for PT Re-Evaluation 04/24/22    Authorization Type WC    PT Start Time 1015    PT Stop Time 1100    PT Time Calculation (min) 45 min    Activity Tolerance Patient tolerated treatment well    Behavior During Therapy [facility] for tasks assessed/performed             Past Medical History:  Diagnosis Date   Trichimoniasis    Past Surgical History:  Procedure Laterality Date   HAND SURGERY     Patient Active Problem List   Diagnosis Date Noted   Adjustment insomnia 07/18/2021   Moderate major depression, single episode ([place]) 05/15/2021   Persistent headaches 05/15/2021   Abnormal cervical Papanicolaou smear 03/02/2018   Left ovarian cyst 06/24/2017    REFERRING DIAG: S40.012A (ICD-10-CM) - Contusion of left shoulder, initial encounter S16.1XXA (ICD-10-CM) - Strain of muscle, fascia and tendon at neck level, initial encounter    THERAPY DIAG:  Acute pain of left shoulder   Muscle weakness (generalized)   Cramp and spasm   Rationale for Evaluation and Treatment Rehabilitation   SUBJECTIVE:                                                                                                                                                         SUBJECTIVE STATEMENT:  Pt states that she is a little sore today and was sore over the weekend following her last session. She is doing some upper body exercises at the gym and notices that while she's doing the exercise it feels ok. When she is finished, she can be really sore on the Lt side. Her HEP is going well.   PERTINENT HISTORY:  None relevant    PAIN:  Are you having pain? Yes: NPRS scale: 5/10 Pain location: L shoulder/UT Pain description: pulling, aching, sore Aggravating factors: using the  arm too much Relieving factors: rest, ibuprofen, heating pad  Not medicated - takes after session    PRECAUTIONS: None   WEIGHT BEARING RESTRICTIONS No   FALLS:  Has patient fallen in last 6 months? 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 There is no evidence of arthropathy or other focal bone abnormality. Soft tissues are unremarkable.   IMPRESSION: Negative.     PATIENT SURVEYS:  FOTO 42% DASH NT   COGNITION:           Overall cognitive status: Within functional limits for tasks assessed                                  SENSATION: WFL   POSTURE: WNL    UPPER EXTREMITY ROM:    Active ROM Right eval Left eval Rt 01/30/22 Rt 02/03/22 RT 02/12/22 LT 03/11/22 Left  Standing  03/20/22  Shoulder flexion   98 PROM 150; AROM 140 AROM 145 145 140 170  Shoulder extension                Shoulder abduction   90 A 115   140 140-painful end range 170  Shoulder adduction                Shoulder internal rotation   FR to mid back  PROM WNL with pain        Reach behind back to opposite inferior angle +pain     Shoulder external rotation   FR NT PROM WNL with pain         Reach behind head to spine of scapula +pain     Elbow flexion                Elbow extension                Wrist flexion                Wrist extension                Wrist ulnar deviation                Wrist radial deviation                Wrist pronation                Wrist supination                (Blank rows = not tested)   UPPER EXTREMITY MMT: updated  03/11/22   MMT Right eval Left eval Left  03/20/22  Shoulder flexion  5/5 4/5 4+  Shoulder extension        Shoulder abduction   4/5 4+  Shoulder adduction        Shoulder internal rotation   4+ 4+  Shoulder external rotation   4+ pain  4+170  Middle trapezius        Lower trapezius        Elbow flexion        Elbow extension        Wrist flexion        Wrist extension        Wrist ulnar deviation        Wrist radial deviation        Wrist pronation        Wrist supination        Grip strength (lbs)        (Blank rows = not tested)   SHOULDER SPECIAL TESTS:            Impingement tests:  NT  SLAP lesions:  NT            Instability tests:  NT            Rotator cuff assessment:  NT            Biceps assessment:  NT   JOINT MOBILITY TESTING:  guarded   PALPATION:  Muscle spasm and tenderness Lt upper trap             TODAY'S TREATMENT:   03/25/22:  Therex UBE x2' fwd/back Seated Lt UE flexion, abduction on blue physioball x10 reps  Rt sidelying: Lt shoulder horizontal abduction x10 reps, abduction x10 reps- verbal cues to decrease shoulder shrug compensation  Standing posterior shoulder stretch against wall, adding tennis ball for posterior shoulder tissue mobilization  Manual Trigger point release Lt levator scap STM Lt posterior shoulder musculature-infraspinatus/teres muscles Lt posterior shoulder stretch across body with PT blocking scapula 5x20 sec hold     03/20/22   UBE L 3 3 min fwd/ 3 min backward Lat Pull 25# 10x- just soreness Seated Row 10x 20# with some pain- limited ROM Chest press 10# 10 x Cable pulleys shld ext 5# 10 x and row 10 x- no issues Tricep ext 20# 2 sets 10 Bicep curl 15# 2 sets 10 with postural cuing Seated row ,shld ext, horz abd 3# 8x 2 sets     03/11/22: Scap depression with arm at 90 deg flexion x10 reps Low rows red TB x10 reps, cues to avoid scap elevation Lt UE flexion slide on wall x10 reps Lt UE A/AROM with  dowel x5 reps Discussed updates to goals   [facility] Adult PT Treatment:                                                DATE: 02/12/22 Therapeutic Exercise: UBE 2 mins in each L1 L Upper trap stretch x2 20" L scalene trap stretch x2 20" Shoulder row 2x10 RTB Shoulder press 2x10 RTB 60d doorway stretch x 30" Standing bilat shoulder ER 2x10 YTB   [facility] Adult PT Treatment:                                                DATE: 02/05/22 Therapeutic Exercise: L Upper trap stretch x3 20" L scalene trap stretch x3 20" Shoulder row 2x10 RTB NuStep 5 mins Manual Therapy STM and DTM with emphasis on tight muscle bands for the upper trap, scalenes, and levator  Modalities: Moist heat to the L shoulder, neck, upper shoulder, and upper back 15 mi   [facility] Adult PT Treatment:                                                DATE: 02/03/22 Therapeutic Exercise: Pulley for shoulder flexion 2 mns UBE forward 1.5  mins forward and backwards Supine chest press c dowel x15 Supine shoulder flexion/ext c dowel x10 Upper trap stretch x5 10" Levator stretch x5 10" Scalene stretch x5 10" L shoulder flexion wall slide x10    Modalities: Moist heat to the L shoulder, neck, upper shoulder,  and upper back 15 mins   [facility] Adult PT Treatment:                                                DATE: 01/29/22 Therapeutic Exercise: Pulley for shoulder flexion 2 mns UBE forward 2 mins Shoulder flexion table top support 2x10 Upper trap  stretch x5 10" Assessment of active and AAROM shoulder flexion   Modalities: Vaso 15 mins, low pressure, 36d    PATIENT EDUCATION: Education details:HEP, sling, nerve tension, ROM, strength inhibited by pain  Person educated: Patient Education method: Explanation, Corporate treasurer cues, Verbal cues, and Handouts Education comprehension: verbalized understanding and needs further education     HOME EXERCISE PROGRAM:       Access Code: [email or web address] URL: [email or web address] Date:  03/11/2022 Prepared by: [facility]   Exercises - Seated Gentle Upper Trapezius Stretch  - 2 x daily - 7 x weekly - 1 sets - 3-5 reps - 30 hold - Standing Shoulder Row with Anchored Resistance  - 2 x daily - 7 x weekly - 2 sets - 10 reps - 3 hold - Standing Single Arm Shoulder Flexion Stretch on Wall  - 2 x daily - 7 x weekly - 1 sets - 5-10 reps - Seated Shoulder Abduction AAROM with Dowel  - 2 x daily - 7 x weekly - 1 sets - 5 reps     ASSESSMENT:   CLINICAL IMPRESSION:  Pt has had a few days of increased soreness of the Lt upper trap since her last session. Today focused on improving Lt shoulder ROM overhead. Pt has significant tightness in the posterior shoulder region, likely contributing to her forward shoulder and difficulty elevating overhead without shoulder shrug. PT completed manual treatment to the posterior shoulder and updated pt's HEP with posterior shoulder stretch and ball mobilization so that she can further address this at home. Ended without overall increase in shoulder pain.   OBJECTIVE IMPAIRM todayENTS decreased activity tolerance, decreased mobility, decreased ROM, decreased strength, increased fascial restrictions, increased muscle spasms, impaired flexibility, impaired UE functional use, and pain.    ACTIVITY LIMITATIONS carrying, lifting, bending, sitting, standing, sleeping, reach over head, hygiene/grooming, and caring for others   PARTICIPATION LIMITATIONS: meal prep, cleaning, laundry, interpersonal relationship, driving, shopping, community activity, occupation, yard work, and school   [place] and 1 comorbidity: none   are also affecting patient's functional outcome.    REHAB POTENTIAL: Excellent   CLINICAL DECISION MAKING: Stable/uncomplicated   EVALUATION COMPLEXITY: Low     GOALS: Goals reviewed with patient? Yes     LONG TERM GOALS: Target date: 02/24/2022  (Remove Blue Hyperlink)    Patient will be I with HEP for  shoulder ROM and strength  Baseline: given on eval  Goal status: 03/20/22 MET   2.  Pt will be able to lift arm overhead with min increase in pain  Baseline: unable > 98 deg  Status: 140 c pain Goal status: Partially MET     3.  Pt will be able to carry, lift items with L UE < 10 lbs for improved work tolerance  Baseline: avoids  Goal status: Ongoing   4.  Pt will be abel to demo 4/5 or more strength in L UE for optimizing shoulder function at work and home.  Baseline:  Status:3/5  Goal status: 03/20/22 MET   5.  Pt will be without the sling and pain minimized at rest  Baseline:  Goal status: MET   6.  Pt will be able to properly carry her book bag for school without increase in Lt shoulder pain. Baseline: unable  Goal status: NEW   7.  Pt will be able to return to the gym and participate atleast 50% of her prior level without Lt shoulder pain.              Baseline: not back at the gym             Goals: 03/20/22 PROGRESSING       PLAN: PT FREQUENCY: 1x/week   PT DURATION: 6 weeks   PLANNED INTERVENTIONS: Therapeutic exercises, Therapeutic activity, Neuromuscular re-education, Balance training, Gait training, Patient/Family education, Joint mobilization, Dry Needling, Electrical stimulation, Cryotherapy, Moist heat, Taping, Ultrasound, Ionotophoresis 34m/ml Dexamethasone, Manual therapy, and Re-evaluation   PLAN FOR NEXT SESSION: cont AAROM as tolerated. Work on posterior shoulder-manual/stretch; scap control and strength progression, consider DN    11:33 AM,03/25/22 SSherol DadePT, DCayeyat BFletcher

## 2022-03-25 ENCOUNTER — Ambulatory Visit: Payer: PRIVATE HEALTH INSURANCE | Admitting: Physical Therapy

## 2022-03-25 ENCOUNTER — Encounter: Payer: Self-pay | Admitting: Physical Therapy

## 2022-03-25 DIAGNOSIS — M6281 Muscle weakness (generalized): Secondary | ICD-10-CM

## 2022-03-25 DIAGNOSIS — M25512 Pain in left shoulder: Secondary | ICD-10-CM | POA: Diagnosis not present

## 2022-03-25 DIAGNOSIS — R252 Cramp and spasm: Secondary | ICD-10-CM

## 2022-03-26 DIAGNOSIS — H5213 Myopia, bilateral: Secondary | ICD-10-CM | POA: Diagnosis not present

## 2022-03-26 DIAGNOSIS — H52223 Regular astigmatism, bilateral: Secondary | ICD-10-CM | POA: Diagnosis not present

## 2022-03-30 ENCOUNTER — Encounter: Payer: Self-pay | Admitting: *Deleted

## 2022-03-30 DIAGNOSIS — Z111 Encounter for screening for respiratory tuberculosis: Secondary | ICD-10-CM | POA: Diagnosis not present

## 2022-03-31 NOTE — Therapy (Deleted)
OUTPATIENT PHYSICAL THERAPY TREATMENT NOTE   Patient Name: Brooke Mejia MRN: [id number] DOB:[birth year], 31 y.o., female Today's Date: 03/31/2022  PCP: *** REFERRING PROVIDER: ***  END OF SESSION:    Past Medical History:  Diagnosis Date   Trichimoniasis    Past Surgical History:  Procedure Laterality Date   HAND SURGERY     Patient Active Problem List   Diagnosis Date Noted   Adjustment insomnia 07/18/2021   Moderate major depression, single episode ([place]) 05/15/2021   Persistent headaches 05/15/2021   Abnormal cervical Papanicolaou smear 03/02/2018   Left ovarian cyst 06/24/2017   REFERRING DIAG: S40.012A (ICD-10-CM) - Contusion of left shoulder, initial encounter S16.1XXA (ICD-10-CM) - Strain of muscle, fascia and tendon at neck level, initial encounter    THERAPY DIAG:  Acute pain of left shoulder   Muscle weakness (generalized)   Cramp and spasm   Rationale for Evaluation and Treatment Rehabilitation   SUBJECTIVE:                                                                                                                                                         SUBJECTIVE STATEMENT:  Pt states that she is a little sore today and was sore over the weekend following her last session. She is doing some upper body exercises at the gym and notices that while she's doing the exercise it feels ok. When she is finished, she can be really sore on the Lt side. Her HEP is going well.    PERTINENT HISTORY:  None relevant    PAIN:  Are you having pain? Yes: NPRS scale: 5/10 Pain location: L shoulder/UT Pain description: pulling, aching, sore Aggravating factors: using the arm too much Relieving factors: rest, ibuprofen, heating pad  Not medicated - takes after session    PRECAUTIONS: None   WEIGHT BEARING RESTRICTIONS No   FALLS:  Has patient fallen in last 6 months? 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 There is no evidence of arthropathy or other focal bone abnormality. Soft tissues are unremarkable.   IMPRESSION: Negative.     PATIENT SURVEYS:  FOTO 42% DASH NT   COGNITION:           Overall cognitive status: Within functional limits for tasks assessed  SENSATION: WFL   POSTURE: WNL    UPPER EXTREMITY ROM:    Active ROM Right eval Left eval Rt 01/30/22 Rt 02/03/22 RT 02/12/22 LT 03/11/22 Left  Standing  03/20/22  Shoulder flexion   98 PROM 150; AROM 140 AROM 145 145 140 170  Shoulder extension                Shoulder abduction   90 A 115   140 140-painful end range 170  Shoulder adduction                Shoulder internal rotation   FR to mid back  PROM WNL with pain        Reach behind back to opposite inferior angle +pain     Shoulder external rotation   FR NT PROM WNL with pain         Reach behind head to spine of scapula +pain     Elbow flexion                Elbow extension                Wrist flexion                Wrist extension                Wrist ulnar deviation                Wrist radial deviation                Wrist pronation                Wrist supination                (Blank rows = not tested)   UPPER EXTREMITY MMT: updated 03/11/22   MMT Right eval Left eval Left  03/20/22  Shoulder flexion  5/5 4/5 4+  Shoulder extension        Shoulder abduction   4/5 4+  Shoulder adduction        Shoulder internal rotation   4+ 4+  Shoulder external rotation   4+ pain  4+170  Middle trapezius        Lower trapezius        Elbow  flexion        Elbow extension        Wrist flexion        Wrist extension        Wrist ulnar deviation        Wrist radial deviation        Wrist pronation        Wrist supination        Grip strength (lbs)        (Blank rows = not tested)   SHOULDER SPECIAL TESTS:            Impingement tests:  NT            SLAP lesions:  NT            Instability tests:  NT            Rotator cuff assessment:  NT            Biceps assessment:  NT   JOINT MOBILITY TESTING:  guarded   PALPATION:  Muscle spasm and tenderness Lt upper trap             TODAY'S TREATMENT:    03/25/22:  Therex UBE x2' fwd/back Seated Lt UE flexion, abduction on blue physioball x10 reps  Rt sidelying: Lt shoulder horizontal abduction x10 reps, abduction x10 reps- verbal cues to decrease shoulder shrug compensation  Standing posterior shoulder stretch against wall, adding tennis ball for posterior shoulder tissue mobilization   Manual Trigger point release Lt levator scap STM Lt posterior shoulder musculature-infraspinatus/teres muscles Lt posterior shoulder stretch across body with PT blocking scapula 5x20 sec hold      03/20/22   UBE L 3 3 min fwd/ 3 min backward Lat Pull 25# 10x- just soreness Seated Row 10x 20# with some pain- limited ROM Chest press 10# 10 x Cable pulleys shld ext 5# 10 x and row 10 x- no issues Tricep ext 20# 2 sets 10 Bicep curl 15# 2 sets 10 with postural cuing Seated row ,shld ext, horz abd 3# 8x 2 sets     03/11/22: Scap depression with arm at 90 deg flexion x10 reps Low rows red TB x10 reps, cues to avoid scap elevation Lt UE flexion slide on wall x10 reps Lt UE A/AROM with dowel x5 reps Discussed updates to goals   [facility] Adult PT Treatment:                                                DATE: 02/12/22 Therapeutic Exercise: UBE 2 mins in each L1 L Upper trap stretch x2 20" L scalene trap stretch x2 20" Shoulder row 2x10 RTB Shoulder press 2x10 RTB 60d doorway stretch  x 30" Standing bilat shoulder ER 2x10 YTB   [facility] Adult PT Treatment:                                                DATE: 02/05/22 Therapeutic Exercise: L Upper trap stretch x3 20" L scalene trap stretch x3 20" Shoulder row 2x10 RTB NuStep 5 mins Manual Therapy STM and DTM with emphasis on tight muscle bands for the upper trap, scalenes, and levator  Modalities: Moist heat to the L shoulder, neck, upper shoulder, and upper back 15 mi   [facility] Adult PT Treatment:                                                DATE: 02/03/22 Therapeutic Exercise: Pulley for shoulder flexion 2 mns UBE forward 1.5  mins forward and backwards Supine chest press c dowel x15 Supine shoulder flexion/ext c dowel x10 Upper trap stretch x5 10" Levator stretch x5 10" Scalene stretch x5 10" L shoulder flexion wall slide x10    Modalities: Moist heat to the L shoulder, neck, upper shoulder, and upper back 15 mins   [facility] Adult PT Treatment:                                                DATE: 01/29/22 Therapeutic Exercise: Pulley for shoulder flexion 2 mns UBE forward 2 mins Shoulder flexion table top support 2x10 Upper trap  stretch x5 10"  Assessment of active and AAROM shoulder flexion   Modalities: Vaso 15 mins, low pressure, 36d    PATIENT EDUCATION: Education details:HEP, sling, nerve tension, ROM, strength inhibited by pain  Person educated: Patient Education method: Explanation, Tactile cues, Verbal cues, and Handouts Education comprehension: verbalized understanding and needs further education     HOME EXERCISE PROGRAM:       Access Code: [email or web address] URL: [email or web address] Date: 03/11/2022 Prepared by: [facility]   Exercises - Seated Gentle Upper Trapezius Stretch  - 2 x daily - 7 x weekly - 1 sets - 3-5 reps - 30 hold - Standing Shoulder Row with Anchored Resistance  - 2 x daily - 7 x weekly - 2 sets - 10 reps - 3 hold - Standing  Single Arm Shoulder Flexion Stretch on Wall  - 2 x daily - 7 x weekly - 1 sets - 5-10 reps - Seated Shoulder Abduction AAROM with Dowel  - 2 x daily - 7 x weekly - 1 sets - 5 reps     ASSESSMENT:   CLINICAL IMPRESSION:  Pt has had a few days of increased soreness of the Lt upper trap since her last session. Today focused on improving Lt shoulder ROM overhead. Pt has significant tightness in the posterior shoulder region, likely contributing to her forward shoulder and difficulty elevating overhead without shoulder shrug. PT completed manual treatment to the posterior shoulder and updated pt's HEP with posterior shoulder stretch and ball mobilization so that she can further address this at home. Ended without overall increase in shoulder pain.    OBJECTIVE IMPAIRM todayENTS decreased activity tolerance, decreased mobility, decreased ROM, decreased strength, increased fascial restrictions, increased muscle spasms, impaired flexibility, impaired UE functional use, and pain.    ACTIVITY LIMITATIONS carrying, lifting, bending, sitting, standing, sleeping, reach over head, hygiene/grooming, and caring for others   PARTICIPATION LIMITATIONS: meal prep, cleaning, laundry, interpersonal relationship, driving, shopping, community activity, occupation, yard work, and school   [place] and 1 comorbidity: none   are also affecting patient's functional outcome.    REHAB POTENTIAL: Excellent   CLINICAL DECISION MAKING: Stable/uncomplicated   EVALUATION COMPLEXITY: Low     GOALS: Goals reviewed with patient? Yes     LONG TERM GOALS: Target date: 02/24/2022  (Remove Blue Hyperlink)   Patient will be I with HEP for  shoulder ROM and strength  Baseline: given on eval  Goal status: 03/20/22 MET   2.  Pt will be able to lift arm overhead with min increase in pain  Baseline: unable > 98 deg  Status: 140 c pain Goal status: Partially MET     3.  Pt will be able to carry, lift items  with L UE < 10 lbs for improved work tolerance  Baseline: avoids  Goal status: Ongoing   4.  Pt will be abel to demo 4/5 or more strength in L UE for optimizing shoulder function at work and home.  Baseline:  Status:3/5 Goal status: 03/20/22 MET   5.  Pt will be without the sling and pain minimized at rest  Baseline:  Goal status: MET   6.  Pt will be able to properly carry her book bag for school without increase in Lt shoulder pain. Baseline: unable  Goal status: NEW   7.  Pt will be able to return to the gym and participate atleast 50% of her prior level without Lt shoulder pain.  Baseline: not back at the gym             Goals: 03/20/22 PROGRESSING       PLAN: PT FREQUENCY: 1x/week   PT DURATION: 6 weeks   PLANNED INTERVENTIONS: Therapeutic exercises, Therapeutic activity, Neuromuscular re-education, Balance training, Gait training, Patient/Family education, Joint mobilization, Dry Needling, Electrical stimulation, Cryotherapy, Moist heat, Taping, Ultrasound, Ionotophoresis 43m/ml Dexamethasone, Manual therapy, and Re-evaluation   PLAN FOR NEXT SESSION: cont AAROM as tolerated. Work on posterior shoulder-manual/stretch; scap control and strength progression, consider DN       SSherol Dade PT 03/31/2022, 7:53 PM

## 2022-04-01 ENCOUNTER — Ambulatory Visit: Payer: PRIVATE HEALTH INSURANCE | Admitting: Physical Therapy

## 2022-04-01 DIAGNOSIS — Z23 Encounter for immunization: Secondary | ICD-10-CM | POA: Diagnosis not present

## 2022-04-02 ENCOUNTER — Ambulatory Visit: Payer: PRIVATE HEALTH INSURANCE | Admitting: Physical Therapy

## 2022-04-02 ENCOUNTER — Encounter: Payer: Self-pay | Admitting: Physical Therapy

## 2022-04-02 DIAGNOSIS — R252 Cramp and spasm: Secondary | ICD-10-CM

## 2022-04-02 DIAGNOSIS — M6281 Muscle weakness (generalized): Secondary | ICD-10-CM

## 2022-04-02 DIAGNOSIS — M25512 Pain in left shoulder: Secondary | ICD-10-CM | POA: Diagnosis not present

## 2022-04-02 NOTE — Therapy (Signed)
OUTPATIENT PHYSICAL THERAPY TREATMENT NOTE   Patient Name: Brooke Mejia MRN: [id number] DOB:January 14, 1991, 31 y.o., female Today's Date: 04/02/2022    END OF SESSION:   PT End of Session - 04/02/22 1309     Visit Number 11    Number of Visits 16    Date for PT Re-Evaluation 04/24/22    Authorization Type WC    PT Start Time 1310    PT Stop Time 1355    PT Time Calculation (min) 45 min    Activity Tolerance Patient tolerated treatment well    Behavior During Therapy [facility] for tasks assessed/performed             Past Medical History:  Diagnosis Date   Trichimoniasis    Past Surgical History:  Procedure Laterality Date   HAND SURGERY     Patient Active Problem List   Diagnosis Date Noted   Adjustment insomnia 07/18/2021   Moderate major depression, single episode ([place]) 05/15/2021   Persistent headaches 05/15/2021   Abnormal cervical Papanicolaou smear 03/02/2018   Left ovarian cyst 06/24/2017    REFERRING DIAG: S40.012A (ICD-10-CM) - Contusion of left shoulder, initial encounter S16.1XXA (ICD-10-CM) - Strain of muscle, fascia and tendon at neck level, initial encounter    THERAPY DIAG:  Acute pain of left shoulder   Muscle weakness (generalized)   Cramp and spasm   Rationale for Evaluation and Treatment Rehabilitation   SUBJECTIVE:                                                                                                                                                         SUBJECTIVE STATEMENT:  Patient reports that this week has been busier and tougher at work/school.  She reports that she is hurting more PERTINENT HISTORY:  None relevant    PAIN:  Are you having pain? Yes: NPRS scale: 7-8/10 Pain location: L shoulder/UT Pain description: pulling, aching, sore Aggravating factors: using the arm too much Relieving factors: rest, ibuprofen, heating pad  Not medicated - takes after session    PRECAUTIONS: None   WEIGHT BEARING RESTRICTIONS No    FALLS:  Has patient fallen in last 6 months? 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 There is no evidence of arthropathy or other focal bone abnormality. Soft tissues are unremarkable.   IMPRESSION: Negative.     PATIENT SURVEYS:  FOTO 42% DASH NT   COGNITION:           Overall cognitive status: Within functional limits for tasks assessed                                  SENSATION: WFL   POSTURE: WNL    UPPER EXTREMITY ROM:    Active ROM Right eval Left eval Rt 01/30/22 Rt 02/03/22 RT 02/12/22 LT 03/11/22 Left  Standing  03/20/22  Shoulder flexion   98 PROM 150; AROM 140 AROM 145 145 140 170  Shoulder extension                Shoulder abduction   90 A 115   140 140-painful end range 170  Shoulder adduction                Shoulder internal rotation   FR to mid back  PROM WNL with pain        Reach behind back to opposite inferior angle +pain     Shoulder external rotation   FR NT PROM WNL with pain         Reach behind head to spine of scapula +pain     Elbow flexion                Elbow extension                Wrist flexion                Wrist extension                Wrist ulnar deviation                Wrist radial deviation                Wrist pronation                Wrist supination                (Blank rows = not tested)   UPPER EXTREMITY MMT: updated 03/11/22   MMT Right eval Left eval Left  03/20/22  Shoulder flexion  5/5 4/5 4+  Shoulder extension        Shoulder abduction   4/5 4+  Shoulder adduction         Shoulder internal rotation   4+ 4+  Shoulder external rotation   4+ pain  4+170  Middle trapezius        Lower trapezius        Elbow flexion        Elbow extension        Wrist flexion        Wrist extension        Wrist ulnar deviation        Wrist radial deviation        Wrist pronation        Wrist supination        Grip strength (lbs)        (Blank rows = not tested)   SHOULDER SPECIAL TESTS:            Impingement tests:  NT            SLAP lesions:  NT            Instability tests:  NT  Rotator cuff assessment:  NT            Biceps assessment:  NT   JOINT MOBILITY TESTING:  guarded   PALPATION:  Muscle spasm and tenderness Lt upper trap             TODAY'S TREATMENT:  04/02/22 UBE 4 minutes total level 2 Shrugs 4# with upper trap and levator stretches Cervical and scapular retraction STM to the left upper trap and neck Gentle occipital release and manual shoulder depression with passive neck motions   03/25/22:  Therex UBE x2' fwd/back Seated Lt UE flexion, abduction on blue physioball x10 reps  Rt sidelying: Lt shoulder horizontal abduction x10 reps, abduction x10 reps- verbal cues to decrease shoulder shrug compensation  Standing posterior shoulder stretch against wall, adding tennis ball for posterior shoulder tissue mobilization  Manual Trigger point release Lt levator scap STM Lt posterior shoulder musculature-infraspinatus/teres muscles Lt posterior shoulder stretch across body with PT blocking scapula 5x20 sec hold     03/20/22   UBE L 3 3 min fwd/ 3 min backward Lat Pull 25# 10x- just soreness Seated Row 10x 20# with some pain- limited ROM Chest press 10# 10 x Cable pulleys shld ext 5# 10 x and row 10 x- no issues Tricep ext 20# 2 sets 10 Bicep curl 15# 2 sets 10 with postural cuing Seated row ,shld ext, horz abd 3# 8x 2 sets     03/11/22: Scap depression with arm at 90 deg flexion x10 reps Low rows red TB x10 reps, cues to avoid  scap elevation Lt UE flexion slide on wall x10 reps Lt UE A/AROM with dowel x5 reps Discussed updates to goals   [facility] Adult PT Treatment:                                                DATE: 02/12/22 Therapeutic Exercise: UBE 2 mins in each L1 L Upper trap stretch x2 20" L scalene trap stretch x2 20" Shoulder row 2x10 RTB Shoulder press 2x10 RTB 60d doorway stretch x 30" Standing bilat shoulder ER 2x10 YTB      PATIENT EDUCATION: Education details:HEP, sling, nerve tension, ROM, strength inhibited by pain  Person educated: Patient Education method: Explanation, Corporate treasurer cues, Verbal cues, and Handouts Education comprehension: verbalized understanding and needs further education     HOME EXERCISE PROGRAM:       Access Code: [email or web address] URL: [email or web address] Date: 03/11/2022 Prepared by: [facility]   Exercises - Seated Gentle Upper Trapezius Stretch  - 2 x daily - 7 x weekly - 1 sets - 3-5 reps - 30 hold - Standing Shoulder Row with Anchored Resistance  - 2 x daily - 7 x weekly - 2 sets - 10 reps - 3 hold - Standing Single Arm Shoulder Flexion Stretch on Wall  - 2 x daily - 7 x weekly - 1 sets - 5-10 reps - Seated Shoulder Abduction AAROM with Dowel  - 2 x daily - 7 x weekly - 1 sets - 5 reps     ASSESSMENT:   CLINICAL IMPRESSION:  Patient comes in to day with a higher rating of pain, she reports that she has been busy and stressed at work/school, she reports not sleeping well last night and waking up and feeling the shoulder pop.  Ratin  gher pain a 7-8/10 today in the left upper trap and neck area.  Here shoulder ROM remains looking good as where it was last measured, she does seem a little more gaurded with her cervical ROM and she is very tight in the cervical and upper trap area  OBJECTIVE IMPAIRM todayENTS decreased activity tolerance, decreased mobility, decreased ROM, decreased strength, increased fascial  restrictions, increased muscle spasms, impaired flexibility, impaired UE functional use, and pain.    ACTIVITY LIMITATIONS carrying, lifting, bending, sitting, standing, sleeping, reach over head, hygiene/grooming, and caring for others   PARTICIPATION LIMITATIONS: meal prep, cleaning, laundry, interpersonal relationship, driving, shopping, community activity, occupation, yard work, and school   [place] and 1 comorbidity: none   are also affecting patient's functional outcome.    REHAB POTENTIAL: Excellent   CLINICAL DECISION MAKING: Stable/uncomplicated   EVALUATION COMPLEXITY: Low     GOALS: Goals reviewed with patient? Yes     LONG TERM GOALS: Target date: 02/24/2022  (Remove Blue Hyperlink)   Patient will be I with HEP for  shoulder ROM and strength  Baseline: given on eval  Goal status: 03/20/22 MET   2.  Pt will be able to lift arm overhead with min increase in pain  Baseline: unable > 98 deg  Status: 140 c pain Goal status: Partially MET     3.  Pt will be able to carry, lift items with L UE < 10 lbs for improved work tolerance  Baseline: avoids  Goal status: Ongoing   4.  Pt will be abel to demo 4/5 or more strength in L UE for optimizing shoulder function at work and home.  Baseline:  Status:3/5 Goal status: 03/20/22 MET   5.  Pt will be without the sling and pain minimized at rest  Baseline:  Goal status: MET   6.  Pt will be able to properly carry her book bag for school without increase in Lt shoulder pain. Baseline: unable  Goal status: NEW   7.  Pt will be able to return to the gym and participate atleast 50% of her prior level without Lt shoulder pain.              Baseline: not back at the gym             Goals: 03/20/22 PROGRESSING       PLAN: PT FREQUENCY: 1x/week   PT DURATION: 6 weeks   PLANNED INTERVENTIONS: Therapeutic exercises, Therapeutic activity, Neuromuscular re-education, Balance training, Gait training,  Patient/Family education, Joint mobilization, Dry Needling, Electrical stimulation, Cryotherapy, Moist heat, Taping, Ultrasound, Ionotophoresis 4m/ml Dexamethasone, Manual therapy, and Re-evaluation   PLAN FOR NEXT SESSION: cont AAROM as tolerated. Work on posterior shoulder-manual/stretch; scap control and strength progression, consider DN   MLum Babe PT 1:10 PM,04/02/22

## 2022-04-06 DIAGNOSIS — Z3202 Encounter for pregnancy test, result negative: Secondary | ICD-10-CM | POA: Diagnosis not present

## 2022-04-06 DIAGNOSIS — N898 Other specified noninflammatory disorders of vagina: Secondary | ICD-10-CM | POA: Diagnosis not present

## 2022-04-06 DIAGNOSIS — Z202 Contact with and (suspected) exposure to infections with a predominantly sexual mode of transmission: Secondary | ICD-10-CM | POA: Diagnosis not present

## 2022-04-06 DIAGNOSIS — R300 Dysuria: Secondary | ICD-10-CM | POA: Diagnosis not present

## 2022-04-06 DIAGNOSIS — Z113 Encounter for screening for infections with a predominantly sexual mode of transmission: Secondary | ICD-10-CM | POA: Diagnosis not present

## 2022-04-06 DIAGNOSIS — F418 Other specified anxiety disorders: Secondary | ICD-10-CM | POA: Diagnosis not present

## 2022-04-08 ENCOUNTER — Ambulatory Visit: Payer: PRIVATE HEALTH INSURANCE | Attending: Nurse Practitioner | Admitting: Physical Therapy

## 2022-04-08 ENCOUNTER — Encounter: Payer: Self-pay | Admitting: Physical Therapy

## 2022-04-08 DIAGNOSIS — M25512 Pain in left shoulder: Secondary | ICD-10-CM | POA: Insufficient documentation

## 2022-04-08 DIAGNOSIS — R252 Cramp and spasm: Secondary | ICD-10-CM | POA: Insufficient documentation

## 2022-04-08 DIAGNOSIS — M6281 Muscle weakness (generalized): Secondary | ICD-10-CM | POA: Diagnosis present

## 2022-04-08 NOTE — Therapy (Signed)
OUTPATIENT PHYSICAL THERAPY TREATMENT NOTE   Patient Name: Brooke Mejia MRN: [id number] DOB:Nov 23, 1990, 31 y.o., female Today's Date: 04/08/2022    END OF SESSION:   PT End of Session - 04/08/22 1014     Visit Number 12    Number of Visits 16    Date for PT Re-Evaluation 04/24/22    Authorization Type WC    PT Start Time 1015    PT Stop Time 1055    PT Time Calculation (min) 40 min    Activity Tolerance Patient tolerated treatment well    Behavior During Therapy [facility] for tasks assessed/performed             Past Medical History:  Diagnosis Date   Trichimoniasis    Past Surgical History:  Procedure Laterality Date   HAND SURGERY     Patient Active Problem List   Diagnosis Date Noted   Adjustment insomnia 07/18/2021   Moderate major depression, single episode ([place]) 05/15/2021   Persistent headaches 05/15/2021   Abnormal cervical Papanicolaou smear 03/02/2018   Left ovarian cyst 06/24/2017    REFERRING DIAG: S40.012A (ICD-10-CM) - Contusion of left shoulder, initial encounter S16.1XXA (ICD-10-CM) - Strain of muscle, fascia and tendon at neck level, initial encounter    THERAPY DIAG:  Acute pain of left shoulder   Muscle weakness (generalized)   Cramp and spasm   Rationale for Evaluation and Treatment Rehabilitation   SUBJECTIVE:                                                                                                                                                         SUBJECTIVE STATEMENT:  Patient reports that last week was a lot of pain. Today she is doing much better. No pain at the moment.  PERTINENT HISTORY:  None relevant    PAIN:  Are you having pain? Yes: NPRS scale: 0/10 Pain location: L shoulder/UT Pain description: pulling, aching, sore Aggravating factors: using the arm too much Relieving factors: rest, ibuprofen, heating pad  Not medicated - takes after session    PRECAUTIONS: None   WEIGHT BEARING RESTRICTIONS No   FALLS:   Has patient fallen in last 6 months? 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 There is no evidence of arthropathy or other focal bone abnormality. Soft tissues are unremarkable.   IMPRESSION: Negative.     PATIENT  SURVEYS:  FOTO 42% DASH NT   COGNITION:           Overall cognitive status: Within functional limits for tasks assessed                                  SENSATION: WFL   POSTURE: WNL    UPPER EXTREMITY ROM:    Active ROM Right eval Left eval Rt 01/30/22 Rt 02/03/22 RT 02/12/22 LT 03/11/22 Left  Standing  03/20/22  Shoulder flexion   98 PROM 150; AROM 140 AROM 145 145 140 170  Shoulder extension                Shoulder abduction   90 A 115   140 140-painful end range 170  Shoulder adduction                Shoulder internal rotation   FR to mid back  PROM WNL with pain        Reach behind back to opposite inferior angle +pain     Shoulder external rotation   FR NT PROM WNL with pain         Reach behind head to spine of scapula +pain     Elbow flexion                Elbow extension                Wrist flexion                Wrist extension                Wrist ulnar deviation                Wrist radial deviation                Wrist pronation                Wrist supination                (Blank rows = not tested)   UPPER EXTREMITY MMT: updated 03/11/22   MMT Right eval Left eval Left  03/20/22  Shoulder flexion  5/5 4/5 4+  Shoulder extension        Shoulder abduction   4/5 4+  Shoulder adduction         Shoulder internal rotation   4+ 4+  Shoulder external rotation   4+ pain  4+170  Middle trapezius        Lower trapezius        Elbow flexion        Elbow extension        Wrist flexion        Wrist extension        Wrist ulnar deviation        Wrist radial deviation        Wrist pronation        Wrist supination        Grip strength (lbs)        (Blank rows = not tested)   SHOULDER SPECIAL TESTS:            Impingement tests:  NT            SLAP lesions:  NT            Instability tests:  NT  Rotator cuff assessment:  NT            Biceps assessment:  NT   JOINT MOBILITY TESTING:  guarded   PALPATION:  Muscle spasm and tenderness Lt upper trap             TODAY'S TREATMENT:  04/09/22: Shrugs 4# x10 reps with end range stretch  BUE AAROM extension x10 reps Lt UE AAROM: IR-reach behind back  Sidelying Lt shoulder ER without weight x10 Supine chest press 4# x10 reps MHP x5 min end of session to further relax pt and decrease tension in the neck- PT educating pt on gradual progression of previous activities moving forward   04/02/22 UBE 4 minutes total level 2 Shrugs 4# with upper trap and levator stretches Cervical and scapular retraction STM to the left upper trap and neck Gentle occipital release and manual shoulder depression with passive neck motions     03/25/22:   Therex UBE x2' fwd/back Seated Lt UE flexion, abduction on blue physioball x10 reps  Rt sidelying: Lt shoulder horizontal abduction x10 reps, abduction x10 reps- verbal cues to decrease shoulder shrug compensation  Standing posterior shoulder stretch against wall, adding tennis ball for posterior shoulder tissue mobilization   Manual Trigger point release Lt levator scap STM Lt posterior shoulder musculature-infraspinatus/teres muscles Lt posterior shoulder stretch across body with PT blocking scapula 5x20 sec hold      03/20/22   UBE L 3 3 min fwd/ 3 min backward Lat Pull 25# 10x-  just soreness Seated Row 10x 20# with some pain- limited ROM Chest press 10# 10 x Cable pulleys shld ext 5# 10 x and row 10 x- no issues Tricep ext 20# 2 sets 10 Bicep curl 15# 2 sets 10 with postural cuing Seated row ,shld ext, horz abd 3# 8x 2 sets     03/11/22: Scap depression with arm at 90 deg flexion x10 reps Low rows red TB x10 reps, cues to avoid scap elevation Lt UE flexion slide on wall x10 reps Lt UE A/AROM with dowel x5 reps Discussed updates to goals   [facility] Adult PT Treatment:                                                DATE: 02/12/22 Therapeutic Exercise: UBE 2 mins in each L1 L Upper trap stretch x2 20" L scalene trap stretch x2 20" Shoulder row 2x10 RTB Shoulder press 2x10 RTB 60d doorway stretch x 30" Standing bilat shoulder ER 2x10 YTB      PATIENT EDUCATION: Education details:HEP, sling, nerve tension, ROM, strength inhibited by pain  Person educated: Patient Education method: Explanation, Corporate treasurer cues, Verbal cues, and Handouts Education comprehension: verbalized understanding and needs further education     HOME EXERCISE PROGRAM:     Access Code: [email or web address] URL: [email or web address] Date: 04/08/2022 Prepared by: [facility]  Exercises - Seated Gentle Upper Trapezius Stretch  - 2 x daily - 7 x weekly - 1 sets - 3-5 reps - 30 hold - Standing Shoulder Row with Anchored Resistance  - 2 x daily - 7 x weekly - 2 sets - 10 reps - 3 hold - Standing Single Arm Shoulder Flexion Stretch on Wall  - 2 x daily - 7 x weekly - 1 sets - 5-10 reps - Seated Shoulder Abduction AAROM  with Dowel  - 2 x daily - 7 x weekly - 1 sets - 5 reps - Standing Cross Body Shoulder Stretch at Wall  - 5 x daily - 7 x weekly - 5 reps - 10 seconds hold - Sidelying Shoulder External Rotation  - 1 x daily - 7 x weekly - 1-2 sets - 10 reps     ASSESSMENT:   CLINICAL IMPRESSION:  Pt did well over the weekend and arrives without  pain in the Lt upper trap/shoulder region. She has been working on her self tissue massage and this has been helpful as decreasing tension throughout the day. Session focused on gradual introduction of shoulder and scapula strength. Pt was able to complete all exercises without increase in pain. HEP was updated to reflect today's change in exercises.    OBJECTIVE IMPAIRM todayENTS decreased activity tolerance, decreased mobility, decreased ROM, decreased strength, increased fascial restrictions, increased muscle spasms, impaired flexibility, impaired UE functional use, and pain.    ACTIVITY LIMITATIONS carrying, lifting, bending, sitting, standing, sleeping, reach over head, hygiene/grooming, and caring for others   PARTICIPATION LIMITATIONS: meal prep, cleaning, laundry, interpersonal relationship, driving, shopping, community activity, occupation, yard work, and school   [place] and 1 comorbidity: none   are also affecting patient's functional outcome.    REHAB POTENTIAL: Excellent   CLINICAL DECISION MAKING: Stable/uncomplicated   EVALUATION COMPLEXITY: Low     GOALS: Goals reviewed with patient? Yes     LONG TERM GOALS: Target date: 02/24/2022  (Remove Blue Hyperlink)   Patient will be I with HEP for  shoulder ROM and strength  Baseline: given on eval  Goal status: 03/20/22 MET   2.  Pt will be able to lift arm overhead with min increase in pain  Baseline: unable > 98 deg  Status: 140 c pain Goal status: Partially MET     3.  Pt will be able to carry, lift items with L UE < 10 lbs for improved work tolerance  Baseline: avoids  Goal status: Ongoing   4.  Pt will be abel to demo 4/5 or more strength in L UE for optimizing shoulder function at work and home.  Baseline:  Status:3/5 Goal status: 03/20/22 MET   5.  Pt will be without the sling and pain minimized at rest  Baseline:  Goal status: MET   6.  Pt will be able to properly carry her book bag for  school without increase in Lt shoulder pain. Baseline: unable  Goal status: NEW   7.  Pt will be able to return to the gym and participate atleast 50% of her prior level without Lt shoulder pain.              Baseline: not back at the gym             Goals: 03/20/22 PROGRESSING       PLAN: PT FREQUENCY: 1x/week   PT DURATION: 6 weeks   PLANNED INTERVENTIONS: Therapeutic exercises, Therapeutic activity, Neuromuscular re-education, Balance training, Gait training, Patient/Family education, Joint mobilization, Dry Needling, Electrical stimulation, Cryotherapy, Moist heat, Taping, Ultrasound, Ionotophoresis 10m/ml Dexamethasone, Manual therapy, and Re-evaluation   PLAN FOR NEXT SESSION: shoulder-manual/stretch; scap control and strength progression, consider DN    10:59 AM,04/08/22 SSherol DadePT, DAdrianat [place]

## 2022-04-09 ENCOUNTER — Other Ambulatory Visit (HOSPITAL_COMMUNITY): Payer: Self-pay

## 2022-04-09 MED ORDER — ESCITALOPRAM OXALATE 10 MG PO TABS
30.0000 mg | ORAL_TABLET | Freq: Every day | ORAL | 1 refills | Status: DC
  Filled 2022-04-09: qty 90, 30d supply, fill #0

## 2022-04-14 NOTE — Therapy (Signed)
OUTPATIENT PHYSICAL THERAPY TREATMENT NOTE   Patient Name: Brooke Mejia MRN: [id number] DOB:1991-06-15, 31 y.o., female Today's Date: 04/16/2022   END OF SESSION:   PT End of Session - 04/16/22 0642     Visit Number 13    Number of Visits 16    Date for PT Re-Evaluation 04/24/22    Authorization Type WC    PT Start Time 1015    PT Stop Time 1100    PT Time Calculation (min) 45 min    Activity Tolerance Patient tolerated treatment well    Behavior During Therapy [facility] for tasks assessed/performed             Past Medical History:  Diagnosis Date   Trichimoniasis    Past Surgical History:  Procedure Laterality Date   HAND SURGERY     Patient Active Problem List   Diagnosis Date Noted   Adjustment insomnia 07/18/2021   Moderate major depression, single episode ([place]) 05/15/2021   Persistent headaches 05/15/2021   Abnormal cervical Papanicolaou smear 03/02/2018   Left ovarian cyst 06/24/2017    REFERRING DIAG: S40.012A (ICD-10-CM) - Contusion of left shoulder, initial encounter S16.1XXA (ICD-10-CM) - Strain of muscle, fascia and tendon at neck level, initial encounter    THERAPY DIAG:  Acute pain of left shoulder   Muscle weakness (generalized)   Cramp and spasm   Rationale for Evaluation and Treatment Rehabilitation   SUBJECTIVE:                                                                                                                                                         SUBJECTIVE STATEMENT:  Patient states that today is going well. She has no pain at arrival. She is focusing mostly on her ball/shoulder massage at home.  PERTINENT HISTORY:  None relevant    PAIN:  Are you having pain? Yes: NPRS scale: 0/10 Pain location: L shoulder/UT Pain description: pulling, aching, sore Aggravating factors: using the arm too much Relieving factors: rest, ibuprofen, heating pad  Not medicated - takes after session    PRECAUTIONS: None   WEIGHT BEARING  RESTRICTIONS No   FALLS:  Has patient fallen in last 6 months? 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 There is no evidence of arthropathy or other focal bone abnormality. Soft tissues are unremarkable.   IMPRESSION: Negative.  PATIENT SURVEYS:  FOTO 42% DASH NT   COGNITION:           Overall cognitive status: Within functional limits for tasks assessed                                  SENSATION: WFL   POSTURE: WNL    UPPER EXTREMITY ROM:    Active ROM Right eval Left eval Rt 01/30/22 Rt 02/03/22 RT 02/12/22 LT 03/11/22 Left  Standing  04/15/22  Shoulder flexion   98 PROM 150; AROM 140 AROM 145 145 140 170  Shoulder extension                Shoulder abduction   90 A 115   140 140-painful end range 170  Shoulder adduction                Shoulder internal rotation   FR to mid back  PROM WNL with pain        Reach behind back to opposite inferior angle +pain  WNL   (+)pain  Shoulder external rotation   FR NT PROM WNL with pain         Reach behind head to spine of scapula +pain   WNL  (+)pain  Elbow flexion                Elbow extension                Wrist flexion                Wrist extension                Wrist ulnar deviation                Wrist radial deviation                Wrist pronation                Wrist supination                (Blank rows = not tested)   UPPER EXTREMITY MMT: updated 03/11/22   MMT Right eval Left eval Left  03/20/22  Shoulder flexion  5/5 4/5 5/5  Shoulder extension         Shoulder abduction   4/5 5/5  Shoulder adduction        Shoulder internal rotation   4+ 5  Shoulder external rotation   4+ pain  5  Middle trapezius      3  Lower trapezius      3  Elbow flexion        Elbow extension        Wrist flexion        Wrist extension        Wrist ulnar deviation        Wrist radial deviation        Wrist pronation        Wrist supination        Grip strength (lbs)        (Blank rows = not tested)   SHOULDER SPECIAL TESTS:            Impingement tests:  NT            SLAP lesions:  NT            Instability tests:  NT  Rotator cuff assessment:  NT            Biceps assessment:  NT   JOINT MOBILITY TESTING:  guarded   PALPATION:  Muscle spasm and tenderness Lt upper trap, levator scap             TODAY'S TREATMENT:  04/15/22: Supine shoulder flexion with yellow TB around wrists x8 reps Sidelying Lt shoulder ER #2  Standing shoulder flexion/ER slide at wall with lift off/hold x10 reps  Manual: STM Lt upper trap, levator scap, infraspinatus  04/09/22: Shrugs 4# x10 reps with end range stretch  BUE AAROM extension x10 reps Lt UE AAROM: IR-reach behind back  Sidelying Lt shoulder ER without weight x10 Supine chest press 4# x10 reps MHP x5 min end of session to further relax pt and decrease tension in the neck- PT educating pt on gradual progression of previous activities moving forward     04/02/22 UBE 4 minutes total level 2 Shrugs 4# with upper trap and levator stretches Cervical and scapular retraction STM to the left upper trap and neck Gentle occipital release and manual shoulder depression with passive neck motions     03/25/22:   Therex UBE x2' fwd/back Seated Lt UE flexion, abduction on blue physioball x10 reps  Rt sidelying: Lt shoulder horizontal abduction x10 reps, abduction x10 reps- verbal cues to decrease shoulder shrug compensation  Standing posterior shoulder stretch against wall, adding tennis ball for  posterior shoulder tissue mobilization   Manual Trigger point release Lt levator scap STM Lt posterior shoulder musculature-infraspinatus/teres muscles Lt posterior shoulder stretch across body with PT blocking scapula 5x20 sec hold      03/20/22   UBE L 3 3 min fwd/ 3 min backward Lat Pull 25# 10x- just soreness Seated Row 10x 20# with some pain- limited ROM Chest press 10# 10 x Cable pulleys shld ext 5# 10 x and row 10 x- no issues Tricep ext 20# 2 sets 10 Bicep curl 15# 2 sets 10 with postural cuing Seated row ,shld ext, horz abd 3# 8x 2 sets           PATIENT EDUCATION: Education details:HEP, sling, nerve tension, ROM, strength inhibited by pain  Person educated: Patient Education method: Explanation, Corporate treasurer cues, Verbal cues, and Handouts Education comprehension: verbalized understanding and needs further education     HOME EXERCISE PROGRAM:     Access Code: [email or web address] URL: [email or web address] Date: 04/08/2022 Prepared by: [facility]   Exercises - Seated Gentle Upper Trapezius Stretch  - 2 x daily - 7 x weekly - 1 sets - 3-5 reps - 30 hold - Standing Shoulder Row with Anchored Resistance  - 2 x daily - 7 x weekly - 2 sets - 10 reps - 3 hold - Standing Single Arm Shoulder Flexion Stretch on Wall  - 2 x daily - 7 x weekly - 1 sets - 5-10 reps - Seated Shoulder Abduction AAROM with Dowel  - 2 x daily - 7 x weekly - 1 sets - 5 reps - Standing Cross Body Shoulder Stretch at Wall  - 5 x daily - 7 x weekly - 5 reps - 10 seconds hold - Sidelying Shoulder External Rotation  - 1 x daily - 7 x weekly - 1-2 sets - 10 reps     ASSESSMENT:   CLINICAL IMPRESSION:  Pt is making progress towards pain free school and work activity. Her Lt shoulder ROM is full, some pain at  end range. Strength increased to 5/5 except for middle and lower traps which are 3/5 at today's session. Pt is back to most of her school and work  activities without limitation. She has to modify carrying her book bag due to discomfort in the upper trap. Pt does have palpable trigger points in the upper trap and levator scap. Her greatest limitation is periscap weakness and poor shoulder mechanics with overhead motions. This is likely contributing to her pain at end range. She could benefit from skilled PT for 2-3 more sessions to address her remaining deficits and allow for her confident return to the gym and other daily activities without risk of increasing shoulder pain.   OBJECTIVE IMPAIRM todayENTS decreased activity tolerance, decreased mobility, decreased ROM, decreased strength, increased fascial restrictions, increased muscle spasms, impaired flexibility, impaired UE functional use, and pain.    ACTIVITY LIMITATIONS carrying, lifting, bending, sitting, standing, sleeping, reach over head, hygiene/grooming, and caring for others   PARTICIPATION LIMITATIONS: meal prep, cleaning, laundry, interpersonal relationship, driving, shopping, community activity, occupation, yard work, and school   [place] and 1 comorbidity: none   are also affecting patient's functional outcome.    REHAB POTENTIAL: Excellent   CLINICAL DECISION MAKING: Stable/uncomplicated   EVALUATION COMPLEXITY: Low     GOALS: Goals reviewed with patient? Yes     LONG TERM GOALS: Target date: 02/24/2022  (Remove Blue Hyperlink)   Patient will be I with HEP for  shoulder ROM and strength  Baseline: given on eval  Goal status: 03/20/22 MET   2.  Pt will be able to lift arm overhead with min increase in pain  Baseline: unable > 98 deg  Status: 140 c pain Goal status: MET     3.  Pt will be able to carry, lift items with L UE < 10 lbs for improved work tolerance  Baseline: avoids  Goal status: Ongoing   4.  Pt will be abel to demo 4/5 or more strength in L UE for optimizing shoulder function at work and home.  Baseline:  Status:3/5 middle  and lower trap Goal status: ONGOING   5.  Pt will be without the sling and pain minimized at rest  Baseline:  Goal status: MET   6.  Pt will be able to properly carry her book bag for school without increase in Lt shoulder pain. Baseline: unable  Goal status: NEW   7.  Pt will be able to return to the gym and participate atleast 50% of her prior level without Lt shoulder pain.              Baseline: not back at the gym             Goals: 03/20/22 PROGRESSING       PLAN: PT FREQUENCY: 1x/every other week   PT DURATION: 6 weeks   PLANNED INTERVENTIONS: Therapeutic exercises, Therapeutic activity, Neuromuscular re-education, Balance training, Gait training, Patient/Family education, Joint mobilization, Dry Needling, Electrical stimulation, Cryotherapy, Moist heat, Taping, Ultrasound, Ionotophoresis 57m/ml Dexamethasone, Manual therapy, and Re-evaluation   PLAN FOR NEXT SESSION: shoulder-manual/stretch; scap control and strength progression, consider DN     6:43 AM,04/16/22 SSherol DadePT, DPT CElktonat [place]

## 2022-04-15 ENCOUNTER — Ambulatory Visit: Payer: PRIVATE HEALTH INSURANCE | Admitting: Physical Therapy

## 2022-04-15 DIAGNOSIS — M25512 Pain in left shoulder: Secondary | ICD-10-CM

## 2022-04-15 DIAGNOSIS — M6281 Muscle weakness (generalized): Secondary | ICD-10-CM

## 2022-04-15 DIAGNOSIS — R252 Cramp and spasm: Secondary | ICD-10-CM

## 2022-04-16 ENCOUNTER — Encounter: Payer: Self-pay | Admitting: Physical Therapy

## 2022-04-17 ENCOUNTER — Encounter: Payer: Self-pay | Admitting: Family

## 2022-04-22 ENCOUNTER — Ambulatory Visit: Payer: PRIVATE HEALTH INSURANCE | Admitting: Physical Therapy

## 2022-04-23 ENCOUNTER — Encounter: Payer: Self-pay | Admitting: Physical Therapy

## 2022-04-23 ENCOUNTER — Ambulatory Visit: Payer: PRIVATE HEALTH INSURANCE | Attending: Nurse Practitioner | Admitting: Physical Therapy

## 2022-04-23 DIAGNOSIS — M25512 Pain in left shoulder: Secondary | ICD-10-CM | POA: Insufficient documentation

## 2022-04-23 DIAGNOSIS — M6281 Muscle weakness (generalized): Secondary | ICD-10-CM | POA: Insufficient documentation

## 2022-04-23 DIAGNOSIS — R252 Cramp and spasm: Secondary | ICD-10-CM | POA: Diagnosis present

## 2022-04-23 NOTE — Therapy (Signed)
OUTPATIENT PHYSICAL THERAPY TREATMENT NOTE   Patient Name: Brooke Mejia MRN: [id number] DOB:1990/08/29, 31 y.o., female Today's Date: 04/23/2022   END OF SESSION:   PT End of Session - 04/23/22 1424     Visit Number 14    Date for PT Re-Evaluation 04/24/22    PT Start Time 1425    PT Stop Time 1515    PT Time Calculation (min) 50 min    Activity Tolerance Patient tolerated treatment well    Behavior During Therapy [facility] for tasks assessed/performed             Past Medical History:  Diagnosis Date   Trichimoniasis    Past Surgical History:  Procedure Laterality Date   HAND SURGERY     Patient Active Problem List   Diagnosis Date Noted   Adjustment insomnia 07/18/2021   Moderate major depression, single episode ([place]) 05/15/2021   Persistent headaches 05/15/2021   Abnormal cervical Papanicolaou smear 03/02/2018   Left ovarian cyst 06/24/2017    REFERRING DIAG: S40.012A (ICD-10-CM) - Contusion of left shoulder, initial encounter S16.1XXA (ICD-10-CM) - Strain of muscle, fascia and tendon at neck level, initial encounter    THERAPY DIAG:  Acute pain of left shoulder   Muscle weakness (generalized)   Cramp and spasm   Rationale for Evaluation and Treatment Rehabilitation   SUBJECTIVE:                                                                                                                                                         SUBJECTIVE STATEMENT:  "Just tired, shoulder a little achy right now"  PERTINENT HISTORY:  None relevant    PAIN:  Are you having pain? Yes: NPRS scale: 4/10 Pain location: L shoulder/UT Pain description: pulling, aching, sore Aggravating factors: using the arm too much Relieving factors: rest, ibuprofen, heating pad  Not medicated - takes after session    PRECAUTIONS: None   WEIGHT BEARING RESTRICTIONS No   FALLS:  Has patient fallen in last 6 months? 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 There is no evidence of arthropathy or other focal bone abnormality. Soft tissues are unremarkable.   IMPRESSION: Negative.     PATIENT SURVEYS:  FOTO 42% DASH NT   COGNITION:           Overall cognitive status: Within functional limits for tasks  assessed                                     UPPER EXTREMITY ROM:    Active ROM Right eval Left eval Rt 01/30/22 Rt 02/03/22 RT 02/12/22 LT 03/11/22 Left  Standing  04/15/22  L shoulder 04/23/22  Shoulder flexion   98 PROM 150; AROM 140 AROM 145 145 140 170 174  Shoulder extension                 Shoulder abduction   90 A 115   140 140-painful end range 170 171  Shoulder adduction                 Shoulder internal rotation   FR to mid back  PROM WNL with pain        Reach behind back to opposite inferior angle +pain  WNL   (+)pain   Shoulder external rotation   FR NT PROM WNL with pain         Reach behind head to spine of scapula +pain   WNL  (+)pain   Elbow flexion                 Elbow extension                 Wrist flexion                 Wrist extension                 Wrist ulnar deviation                 Wrist radial deviation                 Wrist pronation                 Wrist supination                 (Blank rows = not tested)   UPPER EXTREMITY MMT: updated 03/11/22   MMT Right eval Left eval Left  03/20/22  Shoulder flexion  5/5 4/5 5/5  Shoulder extension        Shoulder abduction   4/5 5/5  Shoulder adduction        Shoulder internal rotation   4+ 5  Shoulder external rotation   4+ pain  5   Middle trapezius      3  Lower trapezius      3  Elbow flexion        Elbow extension        Wrist flexion        Wrist extension        Wrist ulnar deviation        Wrist radial deviation        Wrist pronation        Wrist supination        Grip strength (lbs)        (Blank rows = not tested)     JOINT MOBILITY TESTING:  guarded   PALPATION:  Muscle spasm and tenderness Lt upper trap, levator scap             TODAY'S TREATMENT:  04/23/22 UBE L2 x3 min each Seated rows 20lb 2x10  Lats 15lb 2x10 LUE ER yellow 2x10 LUE IR yellow 2x10 Shoulder Flex 2lb 2x10 Shoulder abd 1lb 2x10  STM Upper L trap into cervical para spinales  DN performed by Legrand Como. Albright  04/15/22: Supine shoulder flexion with yellow TB around wrists x8 reps Sidelying Lt shoulder ER #2  Standing shoulder flexion/ER slide at wall with lift off/hold x10 reps  Manual: STM Lt upper trap, levator scap, infraspinatus  04/09/22: Shrugs 4# x10 reps with end range stretch  BUE AAROM extension x10 reps Lt UE AAROM: IR-reach behind back  Sidelying Lt shoulder ER without weight x10 Supine chest press 4# x10 reps MHP x5 min end of session to further relax pt and decrease tension in the neck- PT educating pt on gradual progression of previous activities moving forward       PATIENT EDUCATION: Education details:HEP, sling, nerve tension, ROM, strength inhibited by pain  Person educated: Patient Education method: Explanation, Corporate treasurer cues, Verbal cues, and Handouts Education comprehension: verbalized understanding and needs further education     HOME EXERCISE PROGRAM:     Access Code: [email or web address] URL: [email or web address] Date: 04/08/2022 Prepared by: [facility]   Exercises - Seated Gentle Upper Trapezius Stretch  - 2 x daily - 7 x weekly - 1 sets - 3-5 reps - 30 hold - Standing Shoulder Row with Anchored Resistance  - 2 x daily - 7 x weekly -  2 sets - 10 reps - 3 hold - Standing Single Arm Shoulder Flexion Stretch on Wall  - 2 x daily - 7 x weekly - 1 sets - 5-10 reps - Seated Shoulder Abduction AAROM with Dowel  - 2 x daily - 7 x weekly - 1 sets - 5 reps - Standing Cross Body Shoulder Stretch at Wall  - 5 x daily - 7 x weekly - 5 reps - 10 seconds hold - Sidelying Shoulder External Rotation  - 1 x daily - 7 x weekly - 1-2 sets - 10 reps     ASSESSMENT:   CLINICAL IMPRESSION:  Pt enters with reports of L shoulder aching. Her Lt shoulder ROM remains full.  She continues to have too modify carrying her book bag due to discomfort in the upper trap. Increase L tissues density in the upper L trap compared to R. Positive response to STM evident by improved tissue elasticity.  Lead PT assisted in treatment session providing DN.   OBJECTIVE IMPAIRM todayENTS decreased activity tolerance, decreased mobility, decreased ROM, decreased strength, increased fascial restrictions, increased muscle spasms, impaired flexibility, impaired UE functional use, and pain.    ACTIVITY LIMITATIONS carrying, lifting, bending, sitting, standing, sleeping, reach over head, hygiene/grooming, and caring for others   PARTICIPATION LIMITATIONS: meal prep, cleaning, laundry, interpersonal relationship, driving, shopping, community activity, occupation, yard work, and school   [place] and 1 comorbidity: none   are also affecting patient's functional outcome.    REHAB POTENTIAL: Excellent   CLINICAL DECISION MAKING: Stable/uncomplicated   EVALUATION COMPLEXITY: Low     GOALS: Goals reviewed with patient? Yes     LONG TERM GOALS: Target date: 02/24/2022  (Remove Blue Hyperlink)   Patient will be I with HEP for  shoulder ROM and strength  Baseline: given on eval  Goal status: 03/20/22 MET   2.  Pt will be able to lift arm overhead with min increase in pain  Baseline: unable > 98 deg  Status: 140 c pain Goal status: MET     3.  Pt  will be able to carry, lift items with L UE < 10 lbs for improved  work tolerance  Baseline: avoids  Goal status: Ongoing   4.  Pt will be abel to demo 4/5 or more strength in L UE for optimizing shoulder function at work and home.  Baseline:  Status:3/5 middle and lower trap Goal status: ONGOING   5.  Pt will be without the sling and pain minimized at rest  Baseline:  Goal status: MET   6.  Pt will be able to properly carry her book bag for school without increase in Lt shoulder pain. Baseline: unable  Goal status: NEW   7.  Pt will be able to return to the gym and participate atleast 50% of her prior level without Lt shoulder pain.              Baseline: not back at the gym             Goals: 03/20/22 PROGRESSING       PLAN: PT FREQUENCY: 1x/every other week   PT DURATION: 6 weeks   PLANNED INTERVENTIONS: Therapeutic exercises, Therapeutic activity, Neuromuscular re-education, Balance training, Gait training, Patient/Family education, Joint mobilization, Dry Needling, Electrical stimulation, Cryotherapy, Moist heat, Taping, Ultrasound, Ionotophoresis 67m/ml Dexamethasone, Manual therapy, and Re-evaluation   PLAN FOR NEXT SESSION: shoulder-manual/stretch; scap control and strength progression, consider DN     2:25 PM,04/23/22 SSherol DadePT, DPT [place]

## 2022-04-30 DIAGNOSIS — Z23 Encounter for immunization: Secondary | ICD-10-CM | POA: Diagnosis not present

## 2022-05-01 ENCOUNTER — Encounter: Payer: 59 | Admitting: Family

## 2022-05-04 ENCOUNTER — Ambulatory Visit (INDEPENDENT_AMBULATORY_CARE_PROVIDER_SITE_OTHER): Payer: 59 | Admitting: Family

## 2022-05-04 ENCOUNTER — Encounter: Payer: Self-pay | Admitting: Family

## 2022-05-04 VITALS — BP 113/80 | HR 79 | Temp 97.8°F | Ht 67.0 in | Wt 171.5 lb

## 2022-05-04 DIAGNOSIS — Z020 Encounter for examination for admission to educational institution: Secondary | ICD-10-CM

## 2022-05-04 DIAGNOSIS — F321 Major depressive disorder, single episode, moderate: Secondary | ICD-10-CM

## 2022-05-04 DIAGNOSIS — H6993 Unspecified Eustachian tube disorder, bilateral: Secondary | ICD-10-CM | POA: Diagnosis not present

## 2022-05-04 DIAGNOSIS — F5102 Adjustment insomnia: Secondary | ICD-10-CM | POA: Diagnosis not present

## 2022-05-04 NOTE — Assessment & Plan Note (Signed)
   chronic   doing better on Dayvigo - only takes prn  $30 copay - advised looking online for discount coupon that may reduce cost  refilling today  f/u 6 mos

## 2022-05-04 NOTE — Assessment & Plan Note (Signed)
   chronic  states she is doing better on 30mg  Lexapro, advised this is above recommended dose, so would not go higher.  refilling today  f/u in 6 mos or sooner if needed

## 2022-05-04 NOTE — Progress Notes (Signed)
Patient ID: Brooke Mejia, female    DOB: 1991/03/01, 31 y.o.   MRN: [id number]  Chief Complaint  Patient presents with  . Annual Exam    Non Fasting W/ Labs  . Ear Fullness    Pt states it feels as if her ears need to be popped.  Present months, off and on everyday in the mornings.   . Depression    Dose increased to 30 mg.    HPI: INSOMNIA:  How long: months. Difficulty initiating sleep: yes.  Difficulty maintaining sleep: yes.  OTC meds tried: Advil PM. RX meds in past: none. Sleep hygiene measures: yes. Started any new meds recently: Lexapro. Shift worker: no. 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 Started Trazodone last visit, 1/2 pill not enough and 1 full pill causes too much drowsiness in am. Anxiety/Depression: Patient complains of depression.  She has been doing better on increased Lexapro. She has the following symptoms: difficulty concentrating, fatigue, insomnia, irritable. Onset of symptoms was approximately 3 years ago, She denies current suicidal and homicidal ideation. Risk factors: previous depression. I increased Lexapro last time to 20mg , she states she was still not at goal w/sx, could not get in to see me? so she saw GYN who increased to 30mg  (not indicated) but she says she does feel better College entrance physcial:  pt has been accepted into surgical tech program at [employer] has form to get signed off stating she is physically fit, VS, vision/hearing test.  Ear fullness:  reports having problem for about a year, describes feeling of clogged ears and she has to clear her eustacian tubes constantly, has difficulty hearing at times.  Assessment & Plan:   Problem List Items Addressed This Visit       Other   Moderate major depression, single episode ([place]) - Primary    chronic states she is doing better on 30mg  Lexapro, advised this is above recommended dose, so would not go higher. refilling today f/u in 6 mos or sooner if  needed      Adjustment insomnia    chronic  doing better on Dayvigo - only takes prn $30 copay - advised looking online for discount coupon that may reduce cost refilling today f/u 6 mos      Other Visit Diagnoses     Dysfunction of both eustachian tubes       School physical exam           Subjective:    Outpatient Medications Prior to Visit  Medication Sig Dispense Refill  . aspirin-acetaminophen-caffeine (EXCEDRIN MIGRAINE) 250-250-65 MG tablet Take 2 tablets by mouth as needed for migraine.    . cyclobenzaprine (FLEXERIL) 5 MG tablet Take 1 tablet (5 mg total) by mouth at bedtime as needed for pain. 30 tablet 0  . escitalopram (LEXAPRO) 10 MG tablet Take 3 tablets (30 mg total) by mouth daily. 90 tablet 1  . ibuprofen (ADVIL) 800 MG tablet Take 800 mg by mouth as needed for moderate pain.    . Lemborexant (DAYVIGO) 5 MG TABS Take 1 tablet (5 mg) by mouth at bedtime as needed. 30 tablet 2  . escitalopram (LEXAPRO) 20 MG tablet Take 1 tablet (20 mg total) by mouth daily. 30 tablet 2  . methocarbamol (ROBAXIN) 500 MG tablet Take 1 tablet (500 mg total) by mouth at bedtime as needed for muscle spasms 15 tablet 0  . methylPREDNISolone (MEDROL DOSEPAK) 4 MG TBPK tablet Take as  instructed. 21 tablet 0   No facility-administered medications prior to visit.   Past Medical History:  Diagnosis Date  . Trichimoniasis    Past Surgical History:  Procedure Laterality Date  . HAND SURGERY     No Known Allergies    Objective:    Physical Exam Vitals and nursing note reviewed.  Constitutional:      Appearance: Normal appearance.  Cardiovascular:     Rate and Rhythm: Normal rate and regular rhythm.  Pulmonary:     Effort: Pulmonary effort is normal.     Breath sounds: Normal breath sounds.  Musculoskeletal:        General: Normal range of motion.  Skin:    General: Skin is warm and dry.  Neurological:     Mental Status: She is alert.  Psychiatric:        Mood and  Affect: Mood normal.        Behavior: Behavior normal.   BP 113/80 (BP Location: Left Arm, Patient Position: Sitting, Cuff Size: Large)   Pulse 79   Temp 97.8 F (36.6 C) (Temporal)   Ht 5\' 7"  (1.702 m)   Wt 171 lb 8 oz (77.8 kg)   LMP 04/27/2022 (Exact Date)   SpO2 97%   BMI 26.86 kg/m  Wt Readings from Last 3 Encounters:  05/04/22 171 lb 8 oz (77.8 kg)  11/26/21 159 lb 12.8 oz (72.5 kg)  07/18/21 151 lb 6.4 oz (68.7 kg)       Jeanie Sewer, NP

## 2022-05-05 MED ORDER — ESCITALOPRAM OXALATE 10 MG PO TABS
30.0000 mg | ORAL_TABLET | Freq: Every day | ORAL | 2 refills | Status: DC
  Filled 2022-05-18: qty 90, 30d supply, fill #0

## 2022-05-05 MED ORDER — DAYVIGO 5 MG PO TABS: 5.0000 mg | ORAL_TABLET | Freq: Every evening | ORAL | 3 refills | Status: DC | PRN

## 2022-05-18 ENCOUNTER — Other Ambulatory Visit (HOSPITAL_COMMUNITY): Payer: Self-pay

## 2022-05-18 ENCOUNTER — Other Ambulatory Visit: Payer: Self-pay

## 2022-05-19 ENCOUNTER — Other Ambulatory Visit (HOSPITAL_COMMUNITY): Payer: Self-pay

## 2022-05-20 ENCOUNTER — Other Ambulatory Visit (HOSPITAL_COMMUNITY): Payer: Self-pay

## 2022-08-05 ENCOUNTER — Ambulatory Visit (INDEPENDENT_AMBULATORY_CARE_PROVIDER_SITE_OTHER): Payer: Medicaid Other | Admitting: Family

## 2022-08-05 ENCOUNTER — Encounter: Payer: Self-pay | Admitting: Family

## 2022-08-05 VITALS — BP 118/76 | HR 55 | Temp 97.8°F | Wt 182.0 lb

## 2022-08-05 DIAGNOSIS — F5102 Adjustment insomnia: Secondary | ICD-10-CM | POA: Diagnosis not present

## 2022-08-05 DIAGNOSIS — M542 Cervicalgia: Secondary | ICD-10-CM | POA: Diagnosis not present

## 2022-08-05 DIAGNOSIS — F321 Major depressive disorder, single episode, moderate: Secondary | ICD-10-CM | POA: Diagnosis not present

## 2022-08-05 MED ORDER — BUPROPION HCL ER (SR) 100 MG PO TB12: 100.0000 mg | ORAL_TABLET | Freq: Two times a day (BID) | ORAL | 0 refills | Status: DC

## 2022-08-05 MED ORDER — DAYVIGO 5 MG PO TABS: 5.0000 mg | ORAL_TABLET | Freq: Every evening | ORAL | 3 refills | Status: DC | PRN

## 2022-08-05 NOTE — Progress Notes (Signed)
Patient ID: Brooke Mejia, female    DOB: [birth year], 32 y.o.   MRN: [id number]  Chief Complaint  Patient presents with   Medication refill    Heart burn, Right arm into neck muscle pain and right and left leg pain x 06/29/22 has been treating with flexeril with relief , but only has 3 left. Rx refill on lexapro and dayvigo.     HPI: INSOMNIA:  How long: months. Difficulty initiating sleep: yes.  Difficulty maintaining sleep: yes.  OTC meds tried: Advil PM. RX meds in past: none. Sleep hygiene measures: yes. Started any new meds recently: Lexapro. Shift worker: no. 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 Hx of Trazodone 1/2 pill not enough and 1 full pill causes too much drowsiness in am. Switched to [facility] 5mg , doing well.  Anxiety/Depression: Patient complains of depression.  She had been doing better on increased Lexapro. 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 She has the following symptoms: difficulty concentrating, fatigue, insomnia, irritable. Onset of symptoms was approximately 3 years ago, She denies current suicidal and homicidal ideation. Risk factors: previous depression.  Diffuse pain:  right arm into neck, and bilateral legs. Reports working out using low weights, does not remember any injury. 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 discussed benefits of therapy, sending referral, adding Wellbutrin 100mg  bid, advised on use & SE f/u in 1 mos       Relevant Medications   buPROPion ER (WELLBUTRIN SR) 100 MG 12 hr tablet   Adjustment insomnia    chronic failed Trazodone, doing well on Dayvigo now has Medicaid, sending refill along with PA f/u 6  mos       Relevant Medications   Lemborexant (DAYVIGO) 5 MG TABS   Other Visit Diagnoses     Cervicalgia    - most likely due to increased stress. Advised on using heat up to 69min tid, can also use OTC analgesic creams/patches. Take breaks throughout the day to stretch neck and shoulders. Let me know if PT referral needed. Continue Flexeril prn.    Relevant Medications   cyclobenzaprine (FLEXERIL) 5 MG tablet    Subjective:    Outpatient Medications Prior to Visit  Medication Sig Dispense Refill   aspirin-acetaminophen-caffeine (EXCEDRIN MIGRAINE) 250-250-65 MG tablet Take 2 tablets by mouth as needed for migraine.     cyclobenzaprine (FLEXERIL) 5 MG tablet Take 1 tablet (5 mg total) by mouth at bedtime as needed for pain. 30 tablet 0   escitalopram (LEXAPRO) 10 MG tablet Take 3 tablets (30 mg total) by mouth daily. 90 tablet 2   Lemborexant (DAYVIGO) 5 MG TABS Take 5 mg by mouth at bedtime as needed. 30 tablet 3   No facility-administered medications prior to visit.   Past Medical History:  Diagnosis Date   Trichimoniasis    Past Surgical History:  Procedure Laterality Date   HAND SURGERY     No Known Allergies    Objective:    Physical Exam Vitals and nursing note reviewed.  Constitutional:      Appearance: Normal appearance.  Cardiovascular:     Rate  and Rhythm: Normal rate and regular rhythm.  Pulmonary:     Effort: Pulmonary effort is normal.     Breath sounds: Normal breath sounds.  Musculoskeletal:        General: Normal range of motion.  Skin:    General: Skin is warm and dry.  Neurological:     Mental Status: She is alert.  Psychiatric:        Mood and Affect: Mood normal.        Behavior: Behavior normal.    BP 118/76 (BP Location: Right Arm, Patient Position: Sitting, Cuff Size: Large)   Pulse (!) 55   Temp 97.8 F (36.6 C) (Temporal)   Wt 182 lb (82.6 kg)   LMP 07/14/2022   SpO2 100%   BMI 28.51 kg/m  Wt Readings from Last 3 Encounters:   08/05/22 182 lb (82.6 kg)  05/04/22 171 lb 8 oz (77.8 kg)  11/26/21 159 lb 12.8 oz (72.5 kg)      Jeanie Sewer, NP

## 2022-08-07 MED ORDER — CYCLOBENZAPRINE HCL 5 MG PO TABS: 5.0000 mg | ORAL_TABLET | Freq: Two times a day (BID) | ORAL | 2 refills | Status: DC | PRN

## 2022-08-07 NOTE — Assessment & Plan Note (Signed)
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 discussed benefits of therapy, sending referral, adding Wellbutrin 100mg  bid, advised on use & SE f/u in 1 mos

## 2022-08-07 NOTE — Assessment & Plan Note (Signed)
chronic failed Trazodone, doing well on Dayvigo now has Medicaid, sending refill along with PA f/u 6 mos

## 2022-08-14 ENCOUNTER — Telehealth: Payer: Self-pay

## 2022-08-14 NOTE — Telephone Encounter (Signed)
I call pt's pharmacy to see if pt picked up RX, Pharmacist stated they are unable to run it through insurance due to system showing she has two insurances. I contacted pt and Lvm letting her know she will need to contact [employer] to have them remove the other insurance. Unable to start PA (if needed) at this time.

## 2022-08-14 NOTE — Telephone Encounter (Signed)
-----   Message from Jeanie Sewer, NP sent at 08/07/2022 10:02 PM EST ----- Regarding: PA Please send PA for Dayvigo - pt now has Medicaid since first of year - failed Trazodone

## 2022-08-21 ENCOUNTER — Other Ambulatory Visit: Payer: Self-pay | Admitting: Family

## 2022-08-21 DIAGNOSIS — F321 Major depressive disorder, single episode, moderate: Secondary | ICD-10-CM

## 2022-09-22 ENCOUNTER — Other Ambulatory Visit: Payer: Self-pay | Admitting: Family

## 2022-09-22 DIAGNOSIS — F321 Major depressive disorder, single episode, moderate: Secondary | ICD-10-CM

## 2023-01-20 DIAGNOSIS — Z114 Encounter for screening for human immunodeficiency virus [HIV]: Secondary | ICD-10-CM | POA: Diagnosis not present

## 2023-01-20 DIAGNOSIS — N760 Acute vaginitis: Secondary | ICD-10-CM | POA: Diagnosis not present

## 2023-01-20 DIAGNOSIS — Z113 Encounter for screening for infections with a predominantly sexual mode of transmission: Secondary | ICD-10-CM | POA: Diagnosis not present

## 2023-02-19 DIAGNOSIS — Z3009 Encounter for other general counseling and advice on contraception: Secondary | ICD-10-CM | POA: Diagnosis not present

## 2023-02-19 DIAGNOSIS — N760 Acute vaginitis: Secondary | ICD-10-CM | POA: Diagnosis not present

## 2023-02-19 DIAGNOSIS — R300 Dysuria: Secondary | ICD-10-CM | POA: Diagnosis not present

## 2023-02-19 DIAGNOSIS — Z113 Encounter for screening for infections with a predominantly sexual mode of transmission: Secondary | ICD-10-CM | POA: Diagnosis not present

## 2023-02-19 DIAGNOSIS — N390 Urinary tract infection, site not specified: Secondary | ICD-10-CM | POA: Diagnosis not present

## 2023-02-19 DIAGNOSIS — A599 Trichomoniasis, unspecified: Secondary | ICD-10-CM | POA: Diagnosis not present

## 2023-05-20 ENCOUNTER — Ambulatory Visit (HOSPITAL_COMMUNITY)
Admission: RE | Admit: 2023-05-20 | Discharge: 2023-05-20 | Disposition: A | Discharge status: Home / Self Care | Payer: Medicaid Other | Source: Ambulatory Visit | Attending: Family Medicine | Admitting: Family Medicine

## 2023-05-20 ENCOUNTER — Encounter (HOSPITAL_COMMUNITY): Payer: Self-pay

## 2023-05-20 VITALS — BP 109/82 | HR 97 | Temp 98.2°F | Resp 18 | Ht 67.0 in | Wt 170.0 lb

## 2023-05-20 DIAGNOSIS — J069 Acute upper respiratory infection, unspecified: Secondary | ICD-10-CM | POA: Diagnosis not present

## 2023-05-20 LAB — POC COVID19/FLU A&B COMBO
Covid Antigen, POC: NEGATIVE
Influenza A Antigen, POC: NEGATIVE
Influenza B Antigen, POC: NEGATIVE

## 2023-05-20 MED ORDER — BENZONATATE 100 MG PO CAPS: 100.0000 mg | ORAL_CAPSULE | Freq: Three times a day (TID) | ORAL | 0 refills | Status: DC | PRN

## 2023-05-20 NOTE — ED Provider Notes (Signed)
[facility]    CSN: [id number] Arrival date & time: 05/20/23  1052      History   Chief Complaint Chief Complaint  Patient presents with   Headache   Cough   Sore Throat   Generalized Body Aches    HPI Brooke Mejia is a 32 y.o. female.    Headache Associated symptoms: cough   Cough Associated symptoms: headaches   Sore Throat Associated symptoms include headaches.  Here for headache, cough and congestion and sore throat.  Symptoms began overnight into the early morning of November 12.  No nausea or vomiting or diarrhea.  She has maybe been a little short of breath.  She has not documented any fever, but she has had some chills, especially last night.  She is not allergic to any medication  Last menstrual cycle was October 18  She was around her son who did not feel good about 5 or 6 days ago.   Past Medical History:  Diagnosis Date   Trichimoniasis     Patient Active Problem List   Diagnosis Date Noted   Adjustment insomnia 07/18/2021   Moderate major depression, single episode (HCC) 05/15/2021   Persistent headaches 05/15/2021   Abnormal cervical Papanicolaou smear 03/02/2018   Left ovarian cyst 06/24/2017    Past Surgical History:  Procedure Laterality Date   HAND SURGERY      OB History     Gravida  1   Para  1   Term  1   Preterm      AB      Living  1      SAB      IAB      Ectopic      Multiple      Live Births  1            Home Medications    Prior to Admission medications   Medication Sig Start Date End Date Taking? Authorizing Provider  aspirin-acetaminophen-caffeine (EXCEDRIN MIGRAINE) [phone] MG tablet Take 2 tablets by mouth as needed for migraine.   Yes [provider]  benzonatate (TESSALON) 100 MG capsule Take 1 capsule (100 mg total) by mouth 3 (three) times daily as needed for cough. 05/20/23  Yes Zenia Resides, MD  buPROPion ER [facility] SR) 100 MG 12 hr tablet Take 1  tablet (100 mg total) by mouth 2 (two) times daily. Start with one pill in the morning for 3-4 days, then increase to twice a day. 08/05/22   Dulce Sellar, NP  cyclobenzaprine (FLEXERIL) 5 MG tablet Take 1 tablet (5 mg total) by mouth 2 (two) times daily as needed for muscle spasms (Take for pain. OK to 2 pills at bedtime.). 08/07/22   Dulce Sellar, NP  escitalopram (LEXAPRO) 10 MG tablet TAKE 3 TABLETS(30 MG) BY MOUTH DAILY 08/21/22   Hudnell, Judeth Cornfield, NP  Lemborexant (DAYVIGO) 5 MG TABS Take 1 tablet (5 mg total) by mouth at bedtime as needed. 08/05/22   Dulce Sellar, NP    Family History History reviewed. No pertinent family history.  Social History Social History   Tobacco Use   Smoking status: Never   Smokeless tobacco: Never  Vaping Use   Vaping status: Never Used  Substance Use Topics   Alcohol use: No   Drug use: No     Allergies   Patient has no known allergies.   Review of Systems Review of Systems  Respiratory:  Positive for cough.   Neurological:  Positive for headaches.     Physical Exam Triage Vital Signs ED Triage Vitals  Encounter Vitals Group     BP 05/20/23 1130 109/82     Systolic BP Percentile --      Diastolic BP Percentile --      Pulse Rate 05/20/23 1130 97     Resp 05/20/23 1130 18     Temp 05/20/23 1130 98.2 F (36.8 C)     Temp Source 05/20/23 1130 Oral     SpO2 05/20/23 1130 98 %     Weight 05/20/23 1131 170 lb (77.1 kg)     Height 05/20/23 1131 5\' 7"  (1.702 m)     Head Circumference --      Peak Flow --      Pain Score 05/20/23 1131 7     Pain Loc --      Pain Education --      Exclude from Growth Chart --    No data found.  Updated Vital Signs BP 109/82 (BP Location: Left Arm)   Pulse 97   Temp 98.2 F (36.8 C) (Oral)   Resp 18   Ht 5\' 7"  (1.702 m)   Wt 77.1 kg   LMP 04/23/2023 (Exact Date)   SpO2 98%   BMI 26.63 kg/m   Visual Acuity Right Eye Distance:   Left Eye Distance:   Bilateral Distance:     Right Eye Near:   Left Eye Near:    Bilateral Near:     Physical Exam Vitals reviewed.  Constitutional:      General: She is not in acute distress.    Appearance: She is not ill-appearing, toxic-appearing or diaphoretic.  HENT:     Right Ear: Tympanic membrane and ear canal normal.     Left Ear: Tympanic membrane and ear canal normal.     Nose: Congestion present.     Mouth/Throat:     Mouth: Mucous membranes are moist.     Comments: No erythema or asymmetry.  There is some white mucus in the oropharynx. Eyes:     Extraocular Movements: Extraocular movements intact.     Conjunctiva/sclera: Conjunctivae normal.     Pupils: Pupils are equal, round, and reactive to light.  Cardiovascular:     Rate and Rhythm: Normal rate and regular rhythm.     Heart sounds: No murmur heard. Pulmonary:     Effort: Pulmonary effort is normal. No respiratory distress.     Breath sounds: No stridor. No wheezing, rhonchi or rales.  Musculoskeletal:     Cervical back: Neck supple.  Lymphadenopathy:     Cervical: No cervical adenopathy.  Skin:    Capillary Refill: Capillary refill takes less than 2 seconds.     Coloration: Skin is not jaundiced or pale.  Neurological:     General: No focal deficit present.     Mental Status: She is alert and oriented to person, place, and time.  Psychiatric:        Behavior: Behavior normal.      UC Treatments / Results  Labs (all labs ordered are listed, but only abnormal results are displayed) Labs Reviewed  POC COVID19/FLU A&B COMBO    EKG   Radiology No results found.  Procedures Procedures (including critical care time)  Medications Ordered in UC Medications - No data to display  Initial Impression / Assessment and Plan / UC Course  I have reviewed the triage vital signs and the nursing notes.  Pertinent labs & imaging  results that were available during my care of the patient were reviewed by me and considered in my medical decision  making (see chart for details).     Flu/COVID combo test is negative.  Tessalon Perles are sent in for the cough.  School note is provided Final Clinical Impressions(s) / UC Diagnoses   Final diagnoses:  Viral upper respiratory tract infection     Discharge Instructions      Your flu and COVID tests were negative.  Take benzonatate 100 mg, 1 tab every 8 hours as needed for cough.  NyQuil/DayQuil will probably also be helpful for your symptoms.  Make sure you are drinking enough fluids     ED Prescriptions     Medication Sig Dispense Auth. Provider   benzonatate (TESSALON) 100 MG capsule Take 1 capsule (100 mg total) by mouth 3 (three) times daily as needed for cough. 21 capsule Zenia Resides, MD      PDMP not reviewed this encounter.   Zenia Resides, MD 05/20/23 [phone]

## 2023-05-20 NOTE — ED Triage Notes (Addendum)
Pt presents with complaints of headache, productive cough, sore throat, body aches, and chills x 3 days. Pt denies fevers at this time. Pt reports she has been taking Theraflu, Excedrin, and cough drops for her symptoms. Pt states "the Excedrin is helping with my headaches, I took it last at 9 AM and my headache has went away." Theraflu & cough drops have provided little improvement in symptoms.

## 2023-05-20 NOTE — Discharge Instructions (Signed)
Your flu and COVID tests were negative.  Take benzonatate 100 mg, 1 tab every 8 hours as needed for cough.  NyQuil/DayQuil will probably also be helpful for your symptoms.  Make sure you are drinking enough fluids

## 2023-07-19 DIAGNOSIS — N760 Acute vaginitis: Secondary | ICD-10-CM | POA: Diagnosis not present

## 2023-07-19 DIAGNOSIS — Z114 Encounter for screening for human immunodeficiency virus [HIV]: Secondary | ICD-10-CM | POA: Diagnosis not present

## 2023-07-19 DIAGNOSIS — Z113 Encounter for screening for infections with a predominantly sexual mode of transmission: Secondary | ICD-10-CM | POA: Diagnosis not present

## 2023-09-22 DIAGNOSIS — Z113 Encounter for screening for infections with a predominantly sexual mode of transmission: Secondary | ICD-10-CM | POA: Diagnosis not present

## 2023-09-22 DIAGNOSIS — L309 Dermatitis, unspecified: Secondary | ICD-10-CM | POA: Diagnosis not present

## 2023-09-22 DIAGNOSIS — Z01419 Encounter for gynecological examination (general) (routine) without abnormal findings: Secondary | ICD-10-CM | POA: Diagnosis not present

## 2023-09-22 DIAGNOSIS — G4700 Insomnia, unspecified: Secondary | ICD-10-CM | POA: Diagnosis not present

## 2023-09-22 DIAGNOSIS — N760 Acute vaginitis: Secondary | ICD-10-CM | POA: Diagnosis not present

## 2023-09-22 DIAGNOSIS — Z8742 Personal history of other diseases of the female genital tract: Secondary | ICD-10-CM | POA: Diagnosis not present

## 2023-09-22 DIAGNOSIS — L282 Other prurigo: Secondary | ICD-10-CM | POA: Diagnosis not present

## 2023-09-22 DIAGNOSIS — Z1389 Encounter for screening for other disorder: Secondary | ICD-10-CM | POA: Diagnosis not present

## 2023-09-22 DIAGNOSIS — F418 Other specified anxiety disorders: Secondary | ICD-10-CM | POA: Diagnosis not present

## 2023-09-22 DIAGNOSIS — N898 Other specified noninflammatory disorders of vagina: Secondary | ICD-10-CM | POA: Diagnosis not present

## 2023-09-22 DIAGNOSIS — Z01411 Encounter for gynecological examination (general) (routine) with abnormal findings: Secondary | ICD-10-CM | POA: Diagnosis not present

## 2023-09-23 ENCOUNTER — Encounter: Payer: Self-pay | Admitting: Family

## 2024-05-10 ENCOUNTER — Ambulatory Visit (INDEPENDENT_AMBULATORY_CARE_PROVIDER_SITE_OTHER)

## 2024-05-10 ENCOUNTER — Ambulatory Visit (HOSPITAL_COMMUNITY)
Admission: EM | Admit: 2024-05-10 | Discharge: 2024-05-10 | Disposition: A | Discharge status: Home / Self Care | Attending: Physician Assistant | Admitting: Physician Assistant

## 2024-05-10 ENCOUNTER — Encounter (HOSPITAL_COMMUNITY): Payer: Self-pay | Admitting: Emergency Medicine

## 2024-05-10 DIAGNOSIS — M25512 Pain in left shoulder: Secondary | ICD-10-CM

## 2024-05-10 DIAGNOSIS — M542 Cervicalgia: Secondary | ICD-10-CM | POA: Diagnosis not present

## 2024-05-10 DIAGNOSIS — M791 Myalgia, unspecified site: Secondary | ICD-10-CM

## 2024-05-10 DIAGNOSIS — S161XXA Strain of muscle, fascia and tendon at neck level, initial encounter: Secondary | ICD-10-CM

## 2024-05-10 MED ORDER — NAPROXEN 375 MG PO TABS: 375.0000 mg | ORAL_TABLET | Freq: Two times a day (BID) | ORAL | 0 refills | Status: AC

## 2024-05-10 MED ORDER — METHOCARBAMOL 500 MG PO TABS: 500.0000 mg | ORAL_TABLET | Freq: Two times a day (BID) | ORAL | 0 refills | Status: AC
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
## 2024-05-10 NOTE — Discharge Instructions (Addendum)
 Your x-ray was normal with no evidence of a broken bone or anything out of place.  Please start Naprosyn  twice a day.  Do not take other NSAIDs with this medication including aspirin, ibuprofen /Advil , naproxen /Aleve .  You can use acetaminophen /Tylenol  for breakthrough pain.  Take Robaxin  up to 2 times a day.  This will make you sleepy so do not drive or drink alcohol taking it.  Use heat and gentle stretch for additional symptom relief.  I recommend you follow-up with sports medicine; call to schedule an appointment.  If anything worsens and you have severe pain, numbness or tingling in your hands, weakness, severe headache, nausea/vomiting, going to the bathroom on yourself without noticing it, numbness or tingling in your legs, weakness in your legs you need to be seen immediately.

## 2024-06-06 DIAGNOSIS — Z3202 Encounter for pregnancy test, result negative: Secondary | ICD-10-CM | POA: Diagnosis not present

## 2024-06-06 DIAGNOSIS — Z113 Encounter for screening for infections with a predominantly sexual mode of transmission: Secondary | ICD-10-CM | POA: Diagnosis not present

## 2024-06-06 DIAGNOSIS — Z114 Encounter for screening for human immunodeficiency virus [HIV]: Secondary | ICD-10-CM | POA: Diagnosis not present

## 2024-06-06 DIAGNOSIS — N760 Acute vaginitis: Secondary | ICD-10-CM | POA: Diagnosis not present
